# Patient Record
Sex: Female | Born: 1965
Health system: Southern US, Community
[De-identification: ages and names within clinical notes are randomized; demographics above are authoritative.]

## PROBLEM LIST (undated history)

## (undated) DIAGNOSIS — E079 Disorder of thyroid, unspecified: Secondary | ICD-10-CM

## (undated) DIAGNOSIS — F32A Depression, unspecified: Secondary | ICD-10-CM

## (undated) DIAGNOSIS — F329 Major depressive disorder, single episode, unspecified: Secondary | ICD-10-CM

## (undated) DIAGNOSIS — F419 Anxiety disorder, unspecified: Secondary | ICD-10-CM

## (undated) HISTORY — PX: TONSILLECTOMY AND ADENOIDECTOMY: SHX28

## (undated) HISTORY — DX: Disorder of thyroid, unspecified: E07.9

## (undated) HISTORY — DX: Anxiety disorder, unspecified: F41.9

## (undated) HISTORY — DX: Depression, unspecified: F32.A

## (undated) HISTORY — DX: Major depressive disorder, single episode, unspecified: F32.9

## (undated) HISTORY — PX: OTHER SURGICAL HISTORY: SHX169

## (undated) HISTORY — PX: KNEE SURGERY: SHX244

---

## 2001-05-07 ENCOUNTER — Ambulatory Visit (HOSPITAL_BASED_OUTPATIENT_CLINIC_OR_DEPARTMENT_OTHER): Admission: RE | Admit: 2001-05-07 | Discharge: 2001-05-07 | Payer: Self-pay | Admitting: Urology

## 2005-01-07 ENCOUNTER — Ambulatory Visit: Payer: Self-pay | Admitting: Otolaryngology

## 2006-11-11 ENCOUNTER — Ambulatory Visit: Payer: Self-pay | Admitting: Obstetrics and Gynecology

## 2007-11-17 ENCOUNTER — Ambulatory Visit: Payer: Self-pay | Admitting: Obstetrics and Gynecology

## 2008-08-02 ENCOUNTER — Ambulatory Visit: Payer: Self-pay | Admitting: Endocrinology

## 2008-12-06 ENCOUNTER — Ambulatory Visit: Payer: Self-pay | Admitting: Obstetrics and Gynecology

## 2008-12-13 ENCOUNTER — Ambulatory Visit: Payer: Self-pay | Admitting: Obstetrics and Gynecology

## 2009-06-20 ENCOUNTER — Ambulatory Visit: Payer: Self-pay | Admitting: Obstetrics and Gynecology

## 2010-01-21 ENCOUNTER — Ambulatory Visit: Payer: Self-pay | Admitting: Obstetrics and Gynecology

## 2010-04-02 ENCOUNTER — Ambulatory Visit: Payer: Self-pay | Admitting: Gastroenterology

## 2010-07-08 ENCOUNTER — Ambulatory Visit: Payer: Self-pay | Admitting: Endocrinology

## 2011-04-07 ENCOUNTER — Ambulatory Visit: Payer: Self-pay | Admitting: Obstetrics and Gynecology

## 2011-04-22 ENCOUNTER — Ambulatory Visit: Payer: Self-pay | Admitting: Obstetrics and Gynecology

## 2011-11-14 ENCOUNTER — Ambulatory Visit: Payer: Self-pay | Admitting: Endocrinology

## 2012-04-22 ENCOUNTER — Ambulatory Visit: Payer: Self-pay | Admitting: Obstetrics and Gynecology

## 2012-11-17 ENCOUNTER — Ambulatory Visit: Payer: Self-pay | Admitting: Endocrinology

## 2013-05-03 ENCOUNTER — Ambulatory Visit: Payer: Self-pay | Admitting: Obstetrics and Gynecology

## 2013-11-23 ENCOUNTER — Ambulatory Visit: Payer: Self-pay | Admitting: Endocrinology

## 2014-03-03 ENCOUNTER — Ambulatory Visit (INDEPENDENT_AMBULATORY_CARE_PROVIDER_SITE_OTHER): Payer: BC Managed Care – PPO

## 2014-03-03 ENCOUNTER — Other Ambulatory Visit: Payer: Self-pay | Admitting: Podiatry

## 2014-03-03 ENCOUNTER — Encounter: Payer: Self-pay | Admitting: Podiatry

## 2014-03-03 ENCOUNTER — Ambulatory Visit (INDEPENDENT_AMBULATORY_CARE_PROVIDER_SITE_OTHER): Payer: BC Managed Care – PPO | Admitting: Podiatry

## 2014-03-03 ENCOUNTER — Other Ambulatory Visit: Payer: Self-pay | Admitting: *Deleted

## 2014-03-03 VITALS — BP 139/88 | HR 81 | Resp 16 | Ht 70.5 in | Wt 196.0 lb

## 2014-03-03 DIAGNOSIS — M779 Enthesopathy, unspecified: Secondary | ICD-10-CM

## 2014-03-03 DIAGNOSIS — M7750 Other enthesopathy of unspecified foot: Secondary | ICD-10-CM

## 2014-03-03 MED ORDER — TRIAMCINOLONE ACETONIDE 10 MG/ML IJ SUSP: 10.0000 mg | Freq: Once | INTRAMUSCULAR | Status: DC

## 2014-03-03 MED ORDER — DICLOFENAC SODIUM 75 MG PO TBEC: 75.0000 mg | DELAYED_RELEASE_TABLET | Freq: Two times a day (BID) | ORAL | Status: DC

## 2014-03-03 NOTE — Progress Notes (Signed)
   Subjective:    Patient ID: Melissa Leonard, female    DOB: 12/25/1965, 48 y.o.   MRN: [id number]  HPI Comments: The right  foot ball of foot mostly sore. Between the first and 2nd met . On my feet all day as the day goes on it gets worse. Swells some . Been using muscle rub on it.   Foot Pain      Review of Systems  All other systems reviewed and are negative.      Objective:   Physical Exam        Assessment & Plan:

## 2014-03-03 NOTE — Progress Notes (Signed)
Subjective:     Patient ID: Melissa Leonard, female   DOB: August 09, 1965, 48 y.o.   MRN: [id number]  Foot Pain   patient presents stating I'm having pain around my second metatarsal on top and some swelling on the bottom and it's been hurting me for around 2 months. Affecting my ability to exercise or walk   Review of Systems  All other systems reviewed and are negative.      Objective:   Physical Exam  Nursing note and vitals reviewed. Constitutional: She is oriented to person, place, and time.  Cardiovascular: Intact distal pulses.   Musculoskeletal: Normal range of motion.  Neurological: She is oriented to person, place, and time.  Skin: Skin is warm.   neurovascular status intact with muscle strength adequate in range of motion within normal limits. Patient is noted to have good digital perfusion mild depression of the arch upon weightbearing and does have mild soft tissue swelling on the dorsum of the right foot. Upon palpation the pain is most irritative within the second metatarsal shaft with minimal discomfort in the metatarsal phalangeal joint and no plantar pain upon palpation     Assessment:     Appears to be more tendinitis with possibility for stress fracture second metatarsal and also does not appear to have at this time plantar inflammation    Plan:     H&P and x-rays reviewed. Did a dorsal injection 3 mg Kenalog 5 mg Xylocaine and instructed on ice therapy and placed on diclofenac 75 mg twice a day. Reappoint in 2 weeks earlier if any issues should occur

## 2014-03-17 ENCOUNTER — Ambulatory Visit: Payer: BC Managed Care – PPO | Admitting: Podiatry

## 2014-08-09 ENCOUNTER — Ambulatory Visit: Payer: Self-pay

## 2015-06-20 ENCOUNTER — Other Ambulatory Visit: Payer: Self-pay | Admitting: Obstetrics and Gynecology

## 2015-06-20 DIAGNOSIS — Z1231 Encounter for screening mammogram for malignant neoplasm of breast: Secondary | ICD-10-CM

## 2015-07-11 ENCOUNTER — Other Ambulatory Visit: Payer: Self-pay | Admitting: Endocrinology

## 2015-07-11 DIAGNOSIS — E041 Nontoxic single thyroid nodule: Secondary | ICD-10-CM

## 2015-08-13 ENCOUNTER — Ambulatory Visit
Admission: RE | Admit: 2015-08-13 | Discharge: 2015-08-13 | Disposition: A | Discharge status: Home / Self Care | Payer: BLUE CROSS/BLUE SHIELD | Source: Ambulatory Visit | Attending: Obstetrics and Gynecology | Admitting: Obstetrics and Gynecology

## 2015-08-13 DIAGNOSIS — Z1231 Encounter for screening mammogram for malignant neoplasm of breast: Secondary | ICD-10-CM | POA: Diagnosis not present

## 2015-11-08 ENCOUNTER — Ambulatory Visit
Admission: RE | Admit: 2015-11-08 | Discharge: 2015-11-08 | Disposition: A | Discharge status: Home / Self Care | Payer: BLUE CROSS/BLUE SHIELD | Source: Ambulatory Visit | Attending: Endocrinology | Admitting: Endocrinology

## 2015-11-08 DIAGNOSIS — E041 Nontoxic single thyroid nodule: Secondary | ICD-10-CM

## 2016-06-09 ENCOUNTER — Ambulatory Visit
Admission: EM | Admit: 2016-06-09 | Discharge: 2016-06-09 | Disposition: A | Discharge status: Home / Self Care | Payer: BLUE CROSS/BLUE SHIELD | Attending: Family Medicine | Admitting: Family Medicine

## 2016-06-09 DIAGNOSIS — T148XXA Other injury of unspecified body region, initial encounter: Secondary | ICD-10-CM

## 2016-06-09 DIAGNOSIS — L089 Local infection of the skin and subcutaneous tissue, unspecified: Secondary | ICD-10-CM

## 2016-06-09 DIAGNOSIS — J069 Acute upper respiratory infection, unspecified: Secondary | ICD-10-CM

## 2016-06-09 MED ORDER — CEFUROXIME AXETIL 500 MG PO TABS: 500.0000 mg | ORAL_TABLET | Freq: Two times a day (BID) | ORAL | 0 refills | Status: DC

## 2016-06-09 MED ORDER — MUPIROCIN 2 % EX OINT: 1.0000 "application " | TOPICAL_OINTMENT | Freq: Three times a day (TID) | CUTANEOUS | 0 refills | Status: DC

## 2016-06-09 NOTE — ED Triage Notes (Signed)
Patient complains of a fall last Wednesday when she was picking up her granddaughter. Patient states that she has a abrasion on her right knee. Patient states that she does not feel like this area is healing. Patient also reports that she has been having a cough and chest congestion x 5 days.

## 2016-06-09 NOTE — ED Provider Notes (Signed)
[facility]    CSN: [id number] Arrival date & time: 06/09/16  1430     History   Chief Complaint Chief Complaint  Patient presents with  . Knee Pain    right  . Cough    HPI Melissa Leonard is a 50 y.o. female.   Patient's here because of contusion to the right knee she reports falling last Wednesday. She states she was walking or carrying her sick granddaughter when because of the bulky coat granddaughter she didn't see this was stepping off a curb. She fell and basically sacralized to right knee keep her granddaughter from being hurt. Initially she didn't have any trouble with the right knee but now swollen tender to touch and she states when she took the bandages all today it was oozing a yellowish exudate material.  Problem #2 patient is also here because of a URI. She fell on Thursday but states that witnesses start coughing and chest congestion. She's been taking Mucinex DM but has not helped. History of anxiety depression and thyroid disease. The only significant history she has is that her father had prostate cancer. She never smoked. No known drug allergies.    The history is provided by the patient. No language interpreter was used.  Knee Pain  Location:  Knee Time since incident:  6 days Injury: yes   Knee location:  R knee Pain details:    Quality:  Throbbing and burning   Radiates to:  Does not radiate   Severity:  Moderate   Onset quality:  Sudden   Timing:  Constant   Progression:  Worsening Chronicity:  New Dislocation: yes   Tetanus status:  Up to date Prior injury to area:  Yes Relieved by:  Acetaminophen Worsened by:  Nothing Ineffective treatments:  Movement Associated symptoms: no back pain, no decreased ROM, no fatigue, no fever, no itching and no muscle weakness   Risk factors: no frequent fractures, no known bone disorder and no recent illness   Cough  Associated symptoms: wheezing   Associated symptoms: no fever     Past  Medical History:  Diagnosis Date  . Anxiety   . Depression   . Thyroid disease     There are no active problems to display for this patient.   History reviewed. No pertinent surgical history.  OB History    No data available       Home Medications    Prior to Admission medications   Medication Sig Start Date End Date Taking? Authorizing Provider  estradiol (VIVELLE-DOT) 0.0375 MG/24HR Place 1 patch onto the skin 2 (two) times a week.   Yes Historical Provider, MD  levothyroxine (SYNTHROID, LEVOTHROID) 50 MCG tablet  02/24/14  Yes Historical Provider, MD  PARoxetine (PAXIL) 20 MG tablet  02/03/14  Yes Historical Provider, MD  progesterone (PROMETRIUM) 100 MG capsule Take 100 mg by mouth daily.   Yes Historical Provider, MD  cefUROXime (CEFTIN) 500 MG tablet Take 1 tablet (500 mg total) by mouth 2 (two) times daily. 06/09/16   Hassan Rowan, MD  diclofenac (VOLTAREN) 75 MG EC tablet Take 1 tablet (75 mg total) by mouth 2 (two) times daily. 03/03/14   Lenn Sink, DPM  mupirocin ointment (BACTROBAN) 2 % Apply 1 application topically 3 (three) times daily. 06/09/16   Hassan Rowan, MD    Family History Family History  Problem Relation Age of Onset  . Prostate cancer Father     Social History Social History  Substance  Use Topics  . Smoking status: Never Smoker  . Smokeless tobacco: Never Used  . Alcohol use No     Allergies   Patient has no known allergies.   Review of Systems Review of Systems  Constitutional: Negative for fatigue and fever.  Respiratory: Positive for cough and wheezing.   Musculoskeletal: Negative for back pain.  Skin: Negative for itching.  All other systems reviewed and are negative.    Physical Exam Triage Vital Signs ED Triage Vitals  Enc Vitals Group     BP 06/09/16 1532 110/75     Pulse Rate 06/09/16 1532 90     Resp 06/09/16 1532 17     Temp 06/09/16 1532 98.6 F (37 C)     Temp Source 06/09/16 1532 Oral     SpO2 06/09/16 1532 97 %      Weight 06/09/16 1530 204 lb (92.5 kg)     Height 06/09/16 1530 5\' 10"  (1.778 m)     Head Circumference --      Peak Flow --      Pain Score 06/09/16 1532 3     Pain Loc --      Pain Edu? --      Excl. in GC? --    No data found.   Updated Vital Signs BP 110/75 (BP Location: Left Arm)   Pulse 90   Temp 98.6 F (37 C) (Oral)   Resp 17   Ht 5\' 10"  (1.778 m)   Wt 204 lb (92.5 kg)   SpO2 97%   BMI 29.27 kg/m   Visual Acuity Right Eye Distance:   Left Eye Distance:   Bilateral Distance:    Right Eye Near:   Left Eye Near:    Bilateral Near:     Physical Exam  Constitutional: She is oriented to person, place, and time. She appears well-developed and well-nourished. No distress.  HENT:  Head: Normocephalic and atraumatic.  Right Ear: External ear normal.  Left Ear: External ear normal.  Mouth/Throat: Oropharynx is clear and moist.  Eyes: Conjunctivae are normal. Pupils are equal, round, and reactive to light.  Neck: Normal range of motion. No tracheal deviation present.  Cardiovascular: Normal rate and regular rhythm.   Pulmonary/Chest: Effort normal and breath sounds normal. No respiratory distress. She has no decreased breath sounds. She has no wheezes.  Musculoskeletal: Normal range of motion.  Neurological: She is alert and oriented to person, place, and time.  Skin: Rash noted. She is not diaphoretic. There is erythema.  Psychiatric: She has a normal mood and affect.  Vitals reviewed.    UC Treatments / Results  Labs (all labs ordered are listed, but only abnormal results are displayed) Labs Reviewed - No data to display  EKG  EKG Interpretation None       Radiology No results found.  Procedures Procedures (including critical care time)  Medications Ordered in UC Medications - No data to display   Initial Impression / Assessment and Plan / UC Course  I have reviewed the triage vital signs and the nursing notes.  Pertinent labs & imaging  results that were available during my care of the patient were reviewed by me and considered in my medical decision making (see chart for details).  Clinical Course     For the cellulitis of the right knee will place her on Bactroban ointment to 3 times a day Ceftin 500 mg 1 tablet twice a day. For the URI question whether this is viral  or bacterial but the septum will work for that as well. Offered [employer] she declined and that also will question whether she needs decongestant which she also declined that. She also declined work note. Follow-up PCP in a week if not better.  Final Clinical Impressions(s) / UC Diagnoses   Final diagnoses:  Post-traumatic wound infection  Upper respiratory tract infection, unspecified type    New Prescriptions New Prescriptions   CEFUROXIME (CEFTIN) 500 MG TABLET    Take 1 tablet (500 mg total) by mouth 2 (two) times daily.   MUPIROCIN OINTMENT (BACTROBAN) 2 %    Apply 1 application topically 3 (three) times daily.    Note: This dictation was prepared with Dragon dictation along with smaller phrase technology. Any transcriptional errors that result from this process are unintentional.   Hassan RowanEugene Stpehen Petitjean, MD 06/09/16 1701

## 2016-06-12 ENCOUNTER — Telehealth: Payer: Self-pay

## 2016-06-12 NOTE — Telephone Encounter (Signed)
Courtesy call back completed today for patients recent visit at [facility]. Patient did not answer, left message on voicemail to call back with any questions or concerns.   

## 2016-07-25 ENCOUNTER — Other Ambulatory Visit: Payer: Self-pay | Admitting: Obstetrics and Gynecology

## 2016-07-25 DIAGNOSIS — Z1231 Encounter for screening mammogram for malignant neoplasm of breast: Secondary | ICD-10-CM

## 2016-08-19 ENCOUNTER — Ambulatory Visit
Admission: RE | Admit: 2016-08-19 | Discharge: 2016-08-19 | Disposition: A | Discharge status: Home / Self Care | Payer: BLUE CROSS/BLUE SHIELD | Source: Ambulatory Visit | Attending: Obstetrics and Gynecology | Admitting: Obstetrics and Gynecology

## 2016-08-19 DIAGNOSIS — R928 Other abnormal and inconclusive findings on diagnostic imaging of breast: Secondary | ICD-10-CM | POA: Insufficient documentation

## 2016-08-19 DIAGNOSIS — Z1231 Encounter for screening mammogram for malignant neoplasm of breast: Secondary | ICD-10-CM | POA: Diagnosis present

## 2016-08-28 ENCOUNTER — Other Ambulatory Visit: Payer: Self-pay | Admitting: Obstetrics and Gynecology

## 2016-08-28 DIAGNOSIS — N6489 Other specified disorders of breast: Secondary | ICD-10-CM

## 2016-08-28 DIAGNOSIS — R928 Other abnormal and inconclusive findings on diagnostic imaging of breast: Secondary | ICD-10-CM

## 2016-09-09 ENCOUNTER — Ambulatory Visit
Admission: RE | Admit: 2016-09-09 | Discharge: 2016-09-09 | Disposition: A | Discharge status: Home / Self Care | Payer: BLUE CROSS/BLUE SHIELD | Source: Ambulatory Visit | Attending: Obstetrics and Gynecology | Admitting: Obstetrics and Gynecology

## 2016-09-09 DIAGNOSIS — N6489 Other specified disorders of breast: Secondary | ICD-10-CM | POA: Diagnosis not present

## 2016-09-09 DIAGNOSIS — R928 Other abnormal and inconclusive findings on diagnostic imaging of breast: Secondary | ICD-10-CM

## 2016-09-15 ENCOUNTER — Other Ambulatory Visit: Payer: Self-pay | Admitting: Obstetrics and Gynecology

## 2016-09-15 DIAGNOSIS — N6489 Other specified disorders of breast: Secondary | ICD-10-CM

## 2016-09-25 ENCOUNTER — Other Ambulatory Visit: Payer: Self-pay | Admitting: Obstetrics and Gynecology

## 2016-09-25 DIAGNOSIS — N6489 Other specified disorders of breast: Secondary | ICD-10-CM

## 2016-11-11 ENCOUNTER — Other Ambulatory Visit: Payer: Self-pay | Admitting: Endocrinology

## 2016-11-11 DIAGNOSIS — E041 Nontoxic single thyroid nodule: Secondary | ICD-10-CM

## 2016-11-19 ENCOUNTER — Ambulatory Visit
Admission: RE | Admit: 2016-11-19 | Discharge: 2016-11-19 | Disposition: A | Discharge status: Home / Self Care | Payer: BLUE CROSS/BLUE SHIELD | Source: Ambulatory Visit | Attending: Endocrinology | Admitting: Endocrinology

## 2016-11-19 DIAGNOSIS — E041 Nontoxic single thyroid nodule: Secondary | ICD-10-CM

## 2016-11-19 DIAGNOSIS — E079 Disorder of thyroid, unspecified: Secondary | ICD-10-CM | POA: Insufficient documentation

## 2017-03-13 ENCOUNTER — Ambulatory Visit
Admission: RE | Admit: 2017-03-13 | Discharge: 2017-03-13 | Disposition: A | Discharge status: Home / Self Care | Payer: BLUE CROSS/BLUE SHIELD | Source: Ambulatory Visit | Attending: Obstetrics and Gynecology | Admitting: Obstetrics and Gynecology

## 2017-03-13 DIAGNOSIS — N6489 Other specified disorders of breast: Secondary | ICD-10-CM | POA: Diagnosis present

## 2017-08-10 ENCOUNTER — Other Ambulatory Visit: Payer: Self-pay | Admitting: Obstetrics and Gynecology

## 2017-08-10 DIAGNOSIS — N6489 Other specified disorders of breast: Secondary | ICD-10-CM

## 2017-08-24 ENCOUNTER — Ambulatory Visit
Admission: RE | Admit: 2017-08-24 | Discharge: 2017-08-24 | Disposition: A | Discharge status: Home / Self Care | Payer: BLUE CROSS/BLUE SHIELD | Source: Ambulatory Visit | Attending: Obstetrics and Gynecology | Admitting: Obstetrics and Gynecology

## 2017-08-24 ENCOUNTER — Other Ambulatory Visit: Payer: Self-pay | Admitting: Obstetrics and Gynecology

## 2017-08-24 DIAGNOSIS — N6489 Other specified disorders of breast: Secondary | ICD-10-CM | POA: Diagnosis present

## 2017-08-24 DIAGNOSIS — N6323 Unspecified lump in the left breast, lower outer quadrant: Secondary | ICD-10-CM | POA: Diagnosis not present

## 2017-08-24 DIAGNOSIS — N6320 Unspecified lump in the left breast, unspecified quadrant: Secondary | ICD-10-CM

## 2017-08-24 DIAGNOSIS — R928 Other abnormal and inconclusive findings on diagnostic imaging of breast: Secondary | ICD-10-CM | POA: Insufficient documentation

## 2017-09-17 ENCOUNTER — Other Ambulatory Visit: Payer: Self-pay | Admitting: Obstetrics and Gynecology

## 2017-09-17 DIAGNOSIS — N63 Unspecified lump in unspecified breast: Secondary | ICD-10-CM

## 2017-09-30 ENCOUNTER — Other Ambulatory Visit: Payer: BLUE CROSS/BLUE SHIELD

## 2017-11-09 ENCOUNTER — Other Ambulatory Visit: Payer: Self-pay | Admitting: Endocrinology

## 2017-11-09 DIAGNOSIS — E041 Nontoxic single thyroid nodule: Secondary | ICD-10-CM

## 2017-11-19 ENCOUNTER — Ambulatory Visit: Payer: BLUE CROSS/BLUE SHIELD

## 2018-02-23 ENCOUNTER — Ambulatory Visit
Admission: RE | Admit: 2018-02-23 | Discharge: 2018-02-23 | Disposition: A | Discharge status: Home / Self Care | Payer: BLUE CROSS/BLUE SHIELD | Source: Ambulatory Visit | Attending: Obstetrics and Gynecology | Admitting: Obstetrics and Gynecology

## 2018-02-23 DIAGNOSIS — N63 Unspecified lump in unspecified breast: Secondary | ICD-10-CM

## 2018-10-27 ENCOUNTER — Other Ambulatory Visit: Payer: Self-pay | Admitting: Endocrinology

## 2018-10-27 DIAGNOSIS — E041 Nontoxic single thyroid nodule: Secondary | ICD-10-CM

## 2018-11-05 ENCOUNTER — Ambulatory Visit
Admission: RE | Admit: 2018-11-05 | Discharge: 2018-11-05 | Disposition: A | Discharge status: Home / Self Care | Payer: BLUE CROSS/BLUE SHIELD | Source: Ambulatory Visit | Attending: Endocrinology | Admitting: Endocrinology

## 2018-11-05 ENCOUNTER — Other Ambulatory Visit: Payer: Self-pay

## 2018-11-05 DIAGNOSIS — E041 Nontoxic single thyroid nodule: Secondary | ICD-10-CM | POA: Diagnosis not present

## 2018-12-29 ENCOUNTER — Other Ambulatory Visit: Payer: Self-pay | Admitting: Obstetrics and Gynecology

## 2018-12-29 DIAGNOSIS — Z1231 Encounter for screening mammogram for malignant neoplasm of breast: Secondary | ICD-10-CM

## 2018-12-29 DIAGNOSIS — Z872 Personal history of diseases of the skin and subcutaneous tissue: Secondary | ICD-10-CM

## 2019-01-24 ENCOUNTER — Other Ambulatory Visit: Payer: BLUE CROSS/BLUE SHIELD

## 2019-02-14 ENCOUNTER — Ambulatory Visit
Admission: RE | Admit: 2019-02-14 | Discharge: 2019-02-14 | Disposition: A | Discharge status: Home / Self Care | Payer: BC Managed Care – PPO | Source: Ambulatory Visit | Attending: Obstetrics and Gynecology | Admitting: Obstetrics and Gynecology

## 2019-02-14 DIAGNOSIS — Z872 Personal history of diseases of the skin and subcutaneous tissue: Secondary | ICD-10-CM | POA: Diagnosis present

## 2019-02-14 DIAGNOSIS — Z1231 Encounter for screening mammogram for malignant neoplasm of breast: Secondary | ICD-10-CM

## 2020-01-03 ENCOUNTER — Other Ambulatory Visit: Payer: Self-pay | Admitting: Obstetrics and Gynecology

## 2020-01-05 ENCOUNTER — Other Ambulatory Visit: Payer: Self-pay | Admitting: Obstetrics and Gynecology

## 2020-01-05 DIAGNOSIS — Z1231 Encounter for screening mammogram for malignant neoplasm of breast: Secondary | ICD-10-CM

## 2020-02-22 ENCOUNTER — Other Ambulatory Visit: Payer: Self-pay

## 2020-02-22 ENCOUNTER — Ambulatory Visit
Admission: RE | Admit: 2020-02-22 | Discharge: 2020-02-22 | Disposition: A | Discharge status: Home / Self Care | Payer: BC Managed Care – PPO | Source: Ambulatory Visit | Attending: Obstetrics and Gynecology | Admitting: Obstetrics and Gynecology

## 2020-02-22 DIAGNOSIS — Z1231 Encounter for screening mammogram for malignant neoplasm of breast: Secondary | ICD-10-CM | POA: Insufficient documentation

## 2020-09-24 IMAGING — MG DIGITAL SCREENING BILAT W/ TOMO W/ CAD
8 series · 8 of 24 positions shown · non-contrast
Comparison: Previous exam(s).

CLINICAL DATA: Screening.

EXAM:
DIGITAL SCREENING BILATERAL MAMMOGRAM WITH TOMO AND CAD

[L MLO synth-2D]
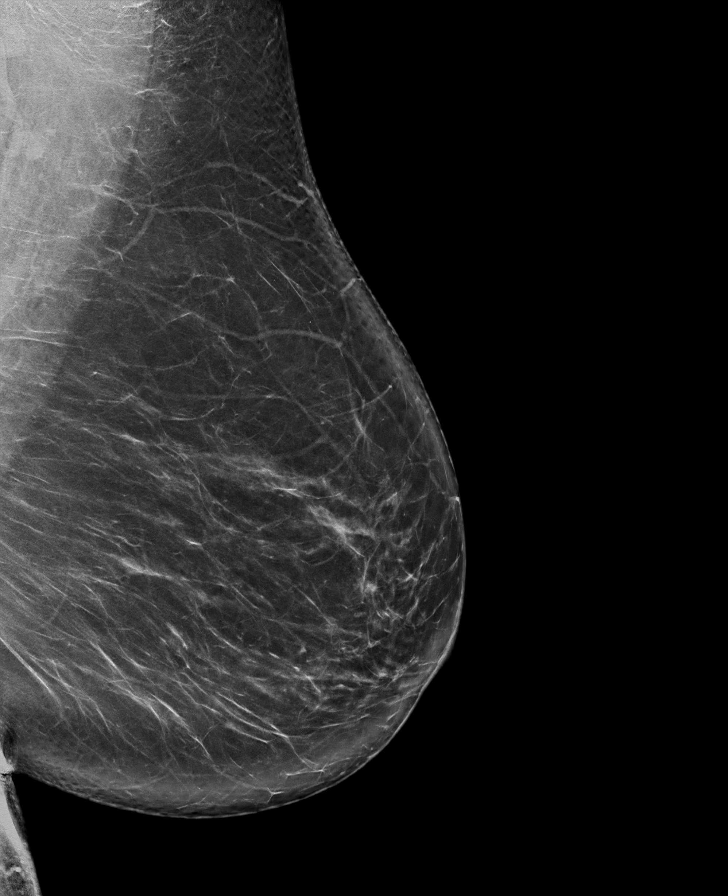

[R CC synth-2D]
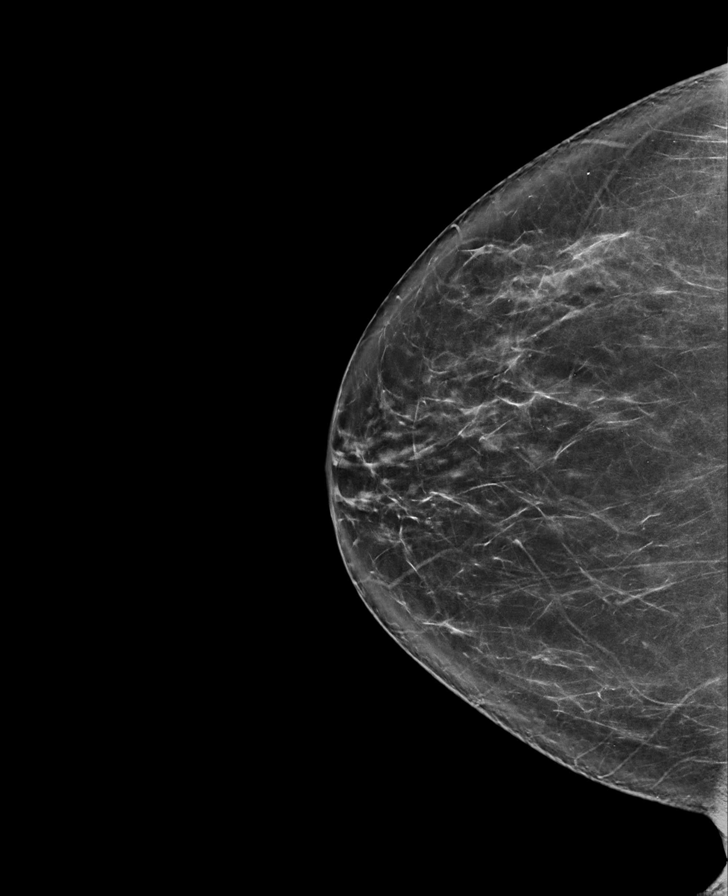

[L CC synth-2D]
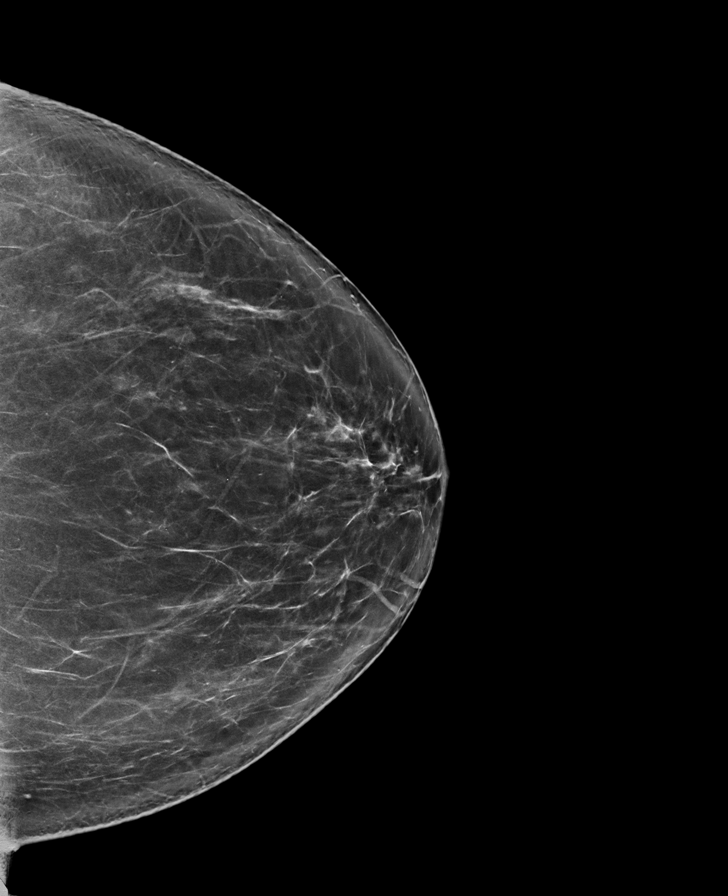

[R MLO synth-2D]
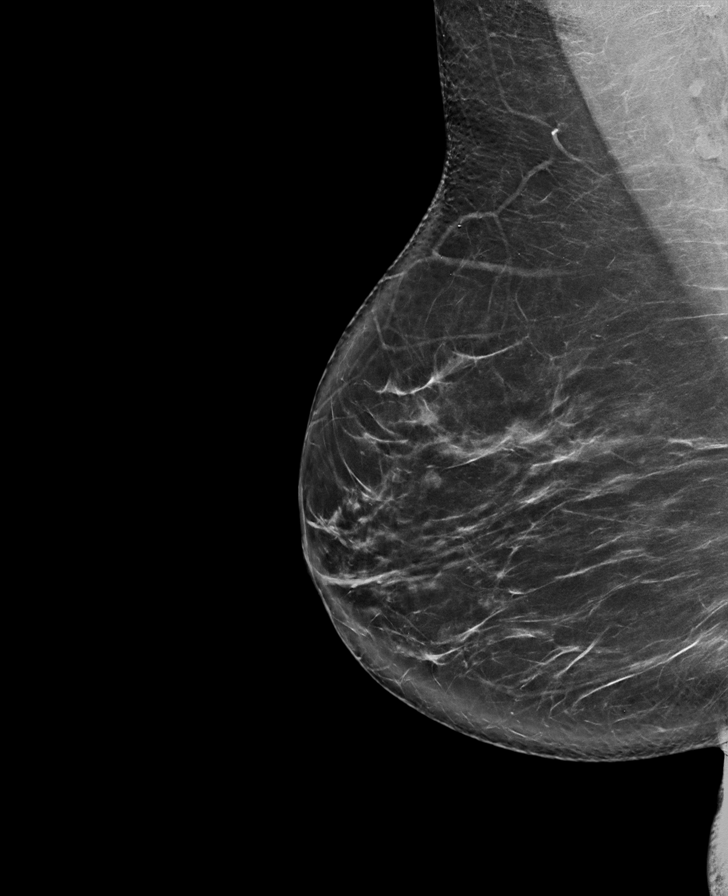

[L CC tomo · tomo slice 43/85.0]
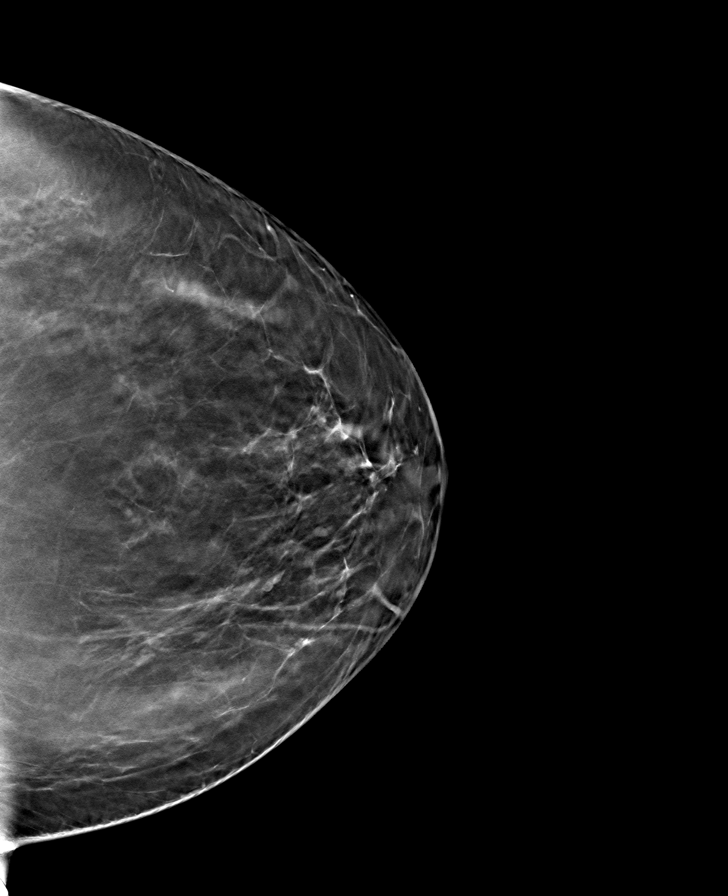

[R CC tomo · tomo slice 41/81.0]
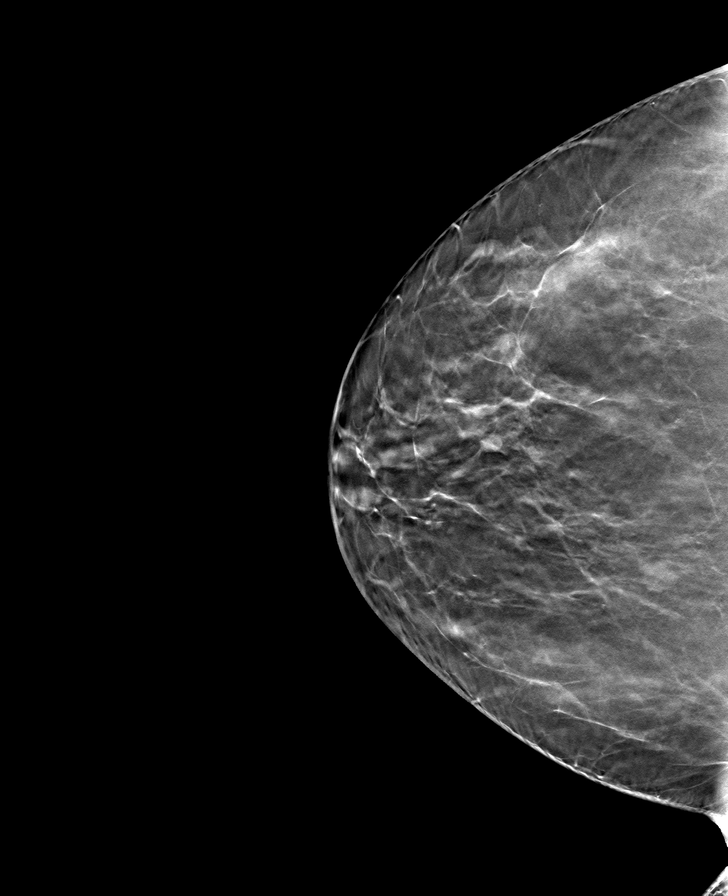

[R MLO tomo · tomo slice 45/88.0]
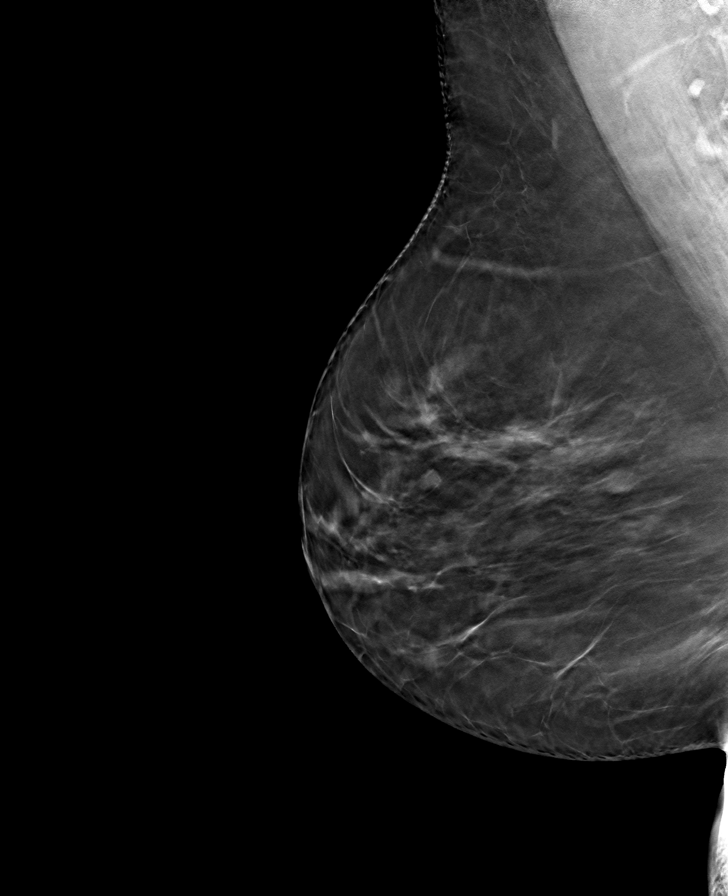

[L MLO tomo · tomo slice 47/92.0]
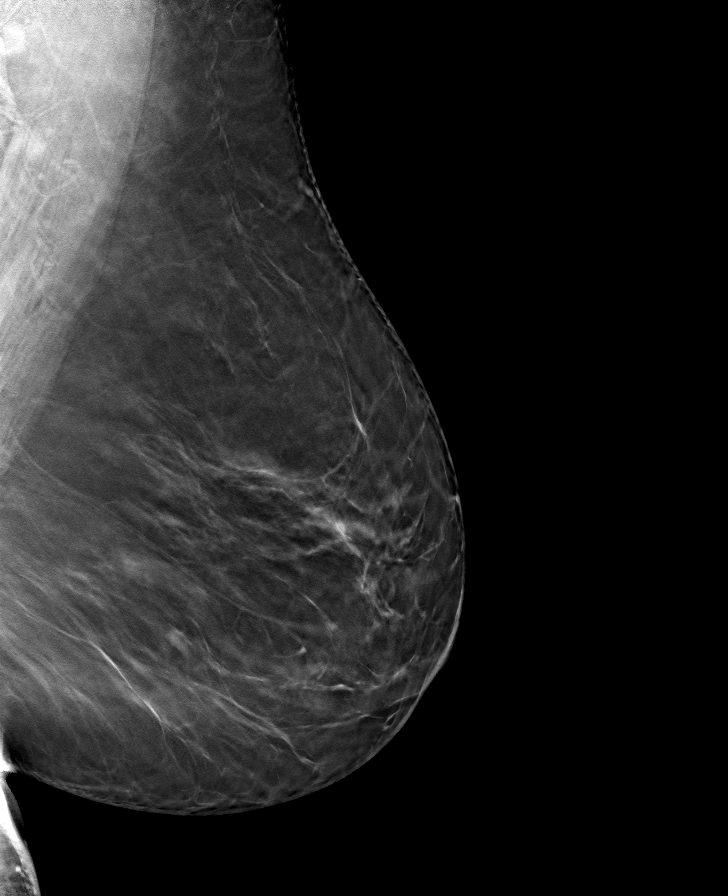

[8 of 24 positions shown; findings below may reference images not displayed]

ACR Breast Density Category b: There are scattered areas of
fibroglandular density.
FINDINGS: There are no findings suspicious for malignancy. Images were
processed with CAD.
IMPRESSION: No mammographic evidence of malignancy. A result letter of this
screening mammogram will be mailed directly to the patient.

RECOMMENDATION:
Screening mammogram in one year. (Code:[id number]-775)

BI-RADS CATEGORY  1: Negative.

## 2021-01-16 ENCOUNTER — Other Ambulatory Visit: Payer: Self-pay | Admitting: Obstetrics and Gynecology

## 2021-01-16 DIAGNOSIS — Z1231 Encounter for screening mammogram for malignant neoplasm of breast: Secondary | ICD-10-CM

## 2021-03-07 ENCOUNTER — Other Ambulatory Visit: Payer: Self-pay

## 2021-03-07 ENCOUNTER — Ambulatory Visit
Admission: RE | Admit: 2021-03-07 | Discharge: 2021-03-07 | Disposition: A | Discharge status: Home / Self Care | Payer: BC Managed Care – PPO | Source: Ambulatory Visit | Attending: Obstetrics and Gynecology | Admitting: Obstetrics and Gynecology

## 2021-03-07 DIAGNOSIS — Z1231 Encounter for screening mammogram for malignant neoplasm of breast: Secondary | ICD-10-CM | POA: Diagnosis not present

## 2021-06-07 ENCOUNTER — Other Ambulatory Visit: Payer: Self-pay

## 2021-06-07 ENCOUNTER — Ambulatory Visit
Admission: RE | Admit: 2021-06-07 | Discharge: 2021-06-07 | Disposition: A | Discharge status: Home / Self Care | Payer: BC Managed Care – PPO | Source: Ambulatory Visit | Attending: Emergency Medicine | Admitting: Emergency Medicine

## 2021-06-07 VITALS — BP 147/81 | HR 100 | Temp 98.5°F | Resp 16

## 2021-06-07 DIAGNOSIS — B349 Viral infection, unspecified: Secondary | ICD-10-CM

## 2021-06-07 NOTE — Discharge Instructions (Addendum)
Your COVID and Flu tests are pending.  You should self quarantine until the test results are back.    Take Tylenol or ibuprofen as needed for fever or discomfort.  Rest and keep yourself hydrated.    Follow-up with your primary care provider if your symptoms are not improving.     

## 2021-06-07 NOTE — ED Provider Notes (Signed)
[facility]    CSN: [id number] Arrival date & time: 06/07/21  1517      History   Chief Complaint Chief Complaint  Patient presents with   Cold Symptoms    HPI Melissa Leonard is a 55 y.o. female.  Patient presents with body aches, itching ears, congestion, runny nose, mild cough since yesterday.  She had sore throat last weekend but now is just scratchy.  No fever, rash, ear pain, shortness of breath, vomiting, diarrhea, or other symptoms.  Treatment attempted with Mucinex and Advil.    The history is provided by the patient and medical records.   Past Medical History:  Diagnosis Date   Anxiety    Depression    Thyroid disease     There are no problems to display for this patient.   History reviewed. No pertinent surgical history.  OB History   No obstetric history on file.      Home Medications    Prior to Admission medications   Medication Sig Start Date End Date Taking? Authorizing Provider  cefUROXime (CEFTIN) 500 MG tablet Take 1 tablet (500 mg total) by mouth 2 (two) times daily. 06/09/16   Hassan Rowan, MD  diclofenac (VOLTAREN) 75 MG EC tablet Take 1 tablet (75 mg total) by mouth 2 (two) times daily. 03/03/14   Lenn Sink, DPM  estradiol (VIVELLE-DOT) 0.0375 MG/24HR Place 1 patch onto the skin 2 (two) times a week.    [provider]  levothyroxine (SYNTHROID, LEVOTHROID) 50 MCG tablet  02/24/14   [provider]  mupirocin ointment (BACTROBAN) 2 % Apply 1 application topically 3 (three) times daily. 06/09/16   Hassan Rowan, MD  PARoxetine (PAXIL) 20 MG tablet  02/03/14   [provider]  progesterone (PROMETRIUM) 100 MG capsule Take 100 mg by mouth daily.    [provider]    Family History Family History  Problem Relation Age of Onset   Prostate cancer Father    Breast cancer Neg Hx     Social History Social History   Tobacco Use   Smoking status: Never   Smokeless tobacco: Never  Vaping  Use   Vaping Use: Never used  Substance Use Topics   Alcohol use: No   Drug use: No     Allergies   Patient has no known allergies.   Review of Systems Review of Systems  Constitutional:  Negative for chills and fever.  HENT:  Positive for congestion, rhinorrhea and sore throat. Negative for ear pain.   Respiratory:  Positive for cough. Negative for shortness of breath.   Cardiovascular:  Negative for chest pain and palpitations.  Gastrointestinal:  Negative for diarrhea and vomiting.  Skin:  Negative for color change and rash.  All other systems reviewed and are negative.   Physical Exam Triage Vital Signs ED Triage Vitals [06/07/21 1535]  Enc Vitals Group     BP      Pulse      Resp      Temp      Temp src      SpO2      Weight      Height      Head Circumference      Peak Flow      Pain Score 0     Pain Loc      Pain Edu?      Excl. in GC?    No data found.  Updated Vital Signs BP Marland Kitchen)  147/81 (BP Location: Left Arm)    Pulse 100    Temp 98.5 F (36.9 C) (Oral)    Resp 16    SpO2 95%   Visual Acuity Right Eye Distance:   Left Eye Distance:   Bilateral Distance:    Right Eye Near:   Left Eye Near:    Bilateral Near:     Physical Exam Vitals and nursing note reviewed.  Constitutional:      General: She is not in acute distress.    Appearance: She is well-developed. She is not ill-appearing.  HENT:     Right Ear: Tympanic membrane normal.     Left Ear: Tympanic membrane normal.     Nose: Nose normal.     Mouth/Throat:     Mouth: Mucous membranes are moist.     Pharynx: Oropharynx is clear.  Cardiovascular:     Rate and Rhythm: Normal rate and regular rhythm.     Heart sounds: Normal heart sounds.  Pulmonary:     Effort: Pulmonary effort is normal. No respiratory distress.     Breath sounds: Normal breath sounds.  Musculoskeletal:     Cervical back: Neck supple.  Skin:    General: Skin is warm and dry.  Neurological:     Mental Status: She  is alert.  Psychiatric:        Mood and Affect: Mood normal.        Behavior: Behavior normal.     UC Treatments / Results  Labs (all labs ordered are listed, but only abnormal results are displayed) Labs Reviewed  COVID-19, FLU A+B NAA    EKG   Radiology No results found.  Procedures Procedures (including critical care time)  Medications Ordered in UC Medications - No data to display  Initial Impression / Assessment and Plan / UC Course  I have reviewed the triage vital signs and the nursing notes.  Pertinent labs & imaging results that were available during my care of the patient were reviewed by me and considered in my medical decision making (see chart for details).    Viral illness.  COVID and Flu pending.  Instructed patient to self quarantine per CDC guidelines.  Discussed symptomatic treatment including Tylenol or ibuprofen, rest, hydration.  Instructed patient to follow up with PCP if symptoms are not improving.  Patient agrees to plan of care.   Final Clinical Impressions(s) / UC Diagnoses   Final diagnoses:  Viral illness     Discharge Instructions      Your COVID and Flu tests are pending.  You should self quarantine until the test results are back.    Take Tylenol or ibuprofen as needed for fever or discomfort.  Rest and keep yourself hydrated.    Follow-up with your primary care provider if your symptoms are not improving.         ED Prescriptions   None    PDMP not reviewed this encounter.   Mickie Bail, NP 06/07/21 1550

## 2021-06-07 NOTE — ED Triage Notes (Signed)
Pt c/o scratchy throat, bodyaches, itchy ears, and chest tightness x 1 week.

## 2021-06-08 LAB — COVID-19, FLU A+B NAA
Influenza A, NAA: NOT DETECTED
Influenza B, NAA: NOT DETECTED
SARS-CoV-2, NAA: DETECTED — AB

## 2021-11-11 ENCOUNTER — Other Ambulatory Visit: Payer: Self-pay | Admitting: Endocrinology

## 2021-11-11 DIAGNOSIS — E049 Nontoxic goiter, unspecified: Secondary | ICD-10-CM

## 2021-11-20 ENCOUNTER — Ambulatory Visit
Admission: RE | Admit: 2021-11-20 | Discharge: 2021-11-20 | Disposition: A | Discharge status: Home / Self Care | Payer: BC Managed Care – PPO | Source: Ambulatory Visit | Attending: Endocrinology | Admitting: Endocrinology

## 2021-11-20 DIAGNOSIS — E049 Nontoxic goiter, unspecified: Secondary | ICD-10-CM

## 2022-01-24 ENCOUNTER — Encounter: Payer: Self-pay | Admitting: Emergency Medicine

## 2022-01-24 ENCOUNTER — Ambulatory Visit
Admission: EM | Admit: 2022-01-24 | Discharge: 2022-01-24 | Disposition: A | Discharge status: Home / Self Care | Payer: BC Managed Care – PPO | Attending: Emergency Medicine | Admitting: Emergency Medicine

## 2022-01-24 DIAGNOSIS — H9201 Otalgia, right ear: Secondary | ICD-10-CM | POA: Diagnosis not present

## 2022-01-24 MED ORDER — NEOMYCIN-POLYMYXIN-HC 3.5-10000-1 OT SUSP: 4.0000 [drp] | Freq: Three times a day (TID) | OTIC | 0 refills | Status: DC

## 2022-01-24 NOTE — ED Provider Notes (Signed)
[name]    CSN: [id number] Arrival date & time: 01/24/22  1055      History   Chief Complaint Chief Complaint  Patient presents with   Ear Fullness    HPI Melissa Leonard is a 56 y.o. female.   Patient presents with right-sided ear fullness for 4 days.  Has accompanying nasal congestion.  Has attempted use of over-the-counter eardrops which has been ineffective.  Endorses that she has been swimming more frequently.  Denies itching, drainage, pain, fever, chills, sore throat.   Past Medical History:  Diagnosis Date   Anxiety    Depression    Thyroid disease     There are no problems to display for this patient.   History reviewed. No pertinent surgical history.  OB History   No obstetric history on file.      Home Medications    Prior to Admission medications   Medication Sig Start Date End Date Taking? Authorizing Provider  estradiol (VIVELLE-DOT) 0.0375 MG/24HR Place 1 patch onto the skin 2 (two) times a week.   Yes [provider]  levothyroxine (SYNTHROID, LEVOTHROID) 50 MCG tablet  02/24/14  Yes [provider]  neomycin-polymyxin-hydrocortisone (CORTISPORIN) 3.5-10000-1 OTIC suspension Place 4 drops into the right ear 3 (three) times daily. 01/24/22  Yes Valinda Hoar, NP  PARoxetine (PAXIL) 20 MG tablet  02/03/14  Yes [provider]  progesterone (PROMETRIUM) 100 MG capsule Take 100 mg by mouth daily.   Yes [provider]  cefUROXime (CEFTIN) 500 MG tablet Take 1 tablet (500 mg total) by mouth 2 (two) times daily. 06/09/16   Hassan Rowan, MD  diclofenac (VOLTAREN) 75 MG EC tablet Take 1 tablet (75 mg total) by mouth 2 (two) times daily. 03/03/14   Lenn Sink, DPM  mupirocin ointment (BACTROBAN) 2 % Apply 1 application topically 3 (three) times daily. 06/09/16   Hassan Rowan, MD    Family History Family History  Problem Relation Age of Onset   Prostate cancer Father    Breast cancer Neg Hx      Social History Social History   Tobacco Use   Smoking status: Never   Smokeless tobacco: Never  Vaping Use   Vaping Use: Never used  Substance Use Topics   Alcohol use: No   Drug use: No     Allergies   Patient has no known allergies.   Review of Systems Review of Systems Defer to HPI    Physical Exam Triage Vital Signs ED Triage Vitals  Enc Vitals Group     BP 01/24/22 1103 (!) 142/87     Pulse Rate 01/24/22 1103 74     Resp 01/24/22 1103 16     Temp 01/24/22 1103 98.7 F (37.1 C)     Temp Source 01/24/22 1103 Oral     SpO2 01/24/22 1103 95 %     Weight --      Height --      Head Circumference --      Peak Flow --      Pain Score 01/24/22 1105 0     Pain Loc --      Pain Edu? --      Excl. in GC? --    No data found.  Updated Vital Signs BP (!) 142/87 (BP Location: Left Arm)   Pulse 74   Temp 98.7 F (37.1 C) (Oral)   Resp 16   SpO2 95%   Visual Acuity Right Eye Distance:  Left Eye Distance:   Bilateral Distance:    Right Eye Near:   Left Eye Near:    Bilateral Near:     Physical Exam Constitutional:      Appearance: Normal appearance.  HENT:     Head: Normocephalic.     Right Ear: Tympanic membrane, ear canal and external ear normal.     Left Ear: Tympanic membrane, ear canal and external ear normal.     Nose: Congestion present.     Mouth/Throat:     Mouth: Mucous membranes are moist.     Pharynx: Oropharynx is clear.  Eyes:     Extraocular Movements: Extraocular movements intact.  Pulmonary:     Effort: Pulmonary effort is normal.     Comments: Dry cough witnessed Skin:    General: Skin is warm and dry.  Neurological:     Mental Status: She is alert and oriented to person, place, and time. Mental status is at baseline.      UC Treatments / Results  Labs (all labs ordered are listed, but only abnormal results are displayed) Labs Reviewed - No data to display  EKG   Radiology No results  found.  Procedures Procedures (including critical care time)  Medications Ordered in UC Medications - No data to display  Initial Impression / Assessment and Plan / UC Course  I have reviewed the triage vital signs and the nursing notes.  Pertinent labs & imaging results that were available during my care of the patient were reviewed by me and considered in my medical decision making (see chart for details).  Otalgia of right ear  Stable patient is in no signs of distress, no abnormalities noted to the bilateral ears, will prophylactically provide coverage Cortisporin drops prescribed, recommended Flonase to be used in conjunction as congestion is most likely a due to symptoms, may follow-up with urgent care as needed if symptoms persist or worsen Final Clinical Impressions(s) / UC Diagnoses   Final diagnoses:  Otalgia of right ear     Discharge Instructions      On exam there were no other abnormalities to the ear  We will begin eardrops prophylactically, Place 4 drops into the right ear 3 times a day (every 8 hours) for 7 days  Your congestion may also be aiding to your ear pain, begin over-the-counter Flonase every morning and every evening to help clear sinuses  You may use Tylenol or ibuprofen for management of discomfort  May hold warm compresses to the ear for additional comfort  Please not attempted any ear cleaning or object or fluid placement into the ear canal to prevent further irritation    ED Prescriptions     Medication Sig Dispense Auth. Provider   neomycin-polymyxin-hydrocortisone (CORTISPORIN) 3.5-10000-1 OTIC suspension Place 4 drops into the right ear 3 (three) times daily. 10 mL Valinda Hoar, NP      PDMP not reviewed this encounter.   Valinda Hoar, NP 01/24/22 1135

## 2022-01-24 NOTE — Discharge Instructions (Addendum)
On exam there were no other abnormalities to the ear  We will begin eardrops prophylactically, Place 4 drops into the right ear 3 times a day (every 8 hours) for 7 days  Your congestion may also be aiding to your ear pain, begin over-the-counter Flonase every morning and every evening to help clear sinuses  You may use Tylenol or ibuprofen for management of discomfort  May hold warm compresses to the ear for additional comfort  Please not attempted any ear cleaning or object or fluid placement into the ear canal to prevent further irritation

## 2022-01-24 NOTE — ED Triage Notes (Signed)
Patient c/o RT ear fullness x 4 days.   Patient denies pain or muffled ear sounds. Patient denies trauma to ear.   Patient endorses swimming this past week.   Patient has used OTC ear drops with no relief of symptoms.

## 2022-02-18 ENCOUNTER — Other Ambulatory Visit: Payer: Self-pay | Admitting: Obstetrics and Gynecology

## 2022-02-18 DIAGNOSIS — Z1231 Encounter for screening mammogram for malignant neoplasm of breast: Secondary | ICD-10-CM

## 2022-03-31 ENCOUNTER — Ambulatory Visit
Admission: EM | Admit: 2022-03-31 | Discharge: 2022-03-31 | Disposition: A | Discharge status: Home / Self Care | Payer: BC Managed Care – PPO | Attending: Family Medicine | Admitting: Family Medicine

## 2022-03-31 ENCOUNTER — Encounter: Payer: Self-pay | Admitting: Emergency Medicine

## 2022-03-31 DIAGNOSIS — J209 Acute bronchitis, unspecified: Secondary | ICD-10-CM | POA: Diagnosis not present

## 2022-03-31 MED ORDER — PROMETHAZINE-DM 6.25-15 MG/5ML PO SYRP: 5.0000 mL | ORAL_SOLUTION | Freq: Four times a day (QID) | ORAL | 0 refills | Status: DC | PRN

## 2022-03-31 MED ORDER — BUDESONIDE-FORMOTEROL FUMARATE 160-4.5 MCG/ACT IN AERO: 2.0000 | INHALATION_SPRAY | Freq: Two times a day (BID) | RESPIRATORY_TRACT | 0 refills | Status: DC

## 2022-03-31 NOTE — ED Triage Notes (Signed)
Symptoms x 1 week. Cough, nasal congestion, ear fullness, headaches. Mucinex helps. Denies SOB, CP, abdominal pain, N/V/D. At home covid test negative twice over the last week. Works as a [employer] manager, family also sick recently.

## 2022-03-31 NOTE — ED Provider Notes (Signed)
[facility]   CSN: [id number] Arrival date & time: 03/31/22  1237     History   Chief Complaint Chief Complaint  Patient presents with   Cough    HPI Melissa Leonard is a 56 y.o. female.   Symptoms x 1 week. Cough, nasal congestion, ear fullness, headaches. Mucinex helps. Denies SOB, CP, abdominal pain, N/V/D. At home covid test negative twice over the last week. Works as a [employer] manager, family also sick recently.       Past Medical History:  Diagnosis Date   Anxiety    Depression    Thyroid disease     There are no problems to display for this patient.   History reviewed. No pertinent surgical history.  OB History   No obstetric history on file.      Home Medications    Prior to Admission medications   Medication Sig Start Date End Date Taking? Authorizing Provider  budesonide-formoterol (SYMBICORT) 160-4.5 MCG/ACT inhaler Inhale 2 puffs into the lungs 2 (two) times daily. 03/31/22  Yes Volney American, PA-C  promethazine-dextromethorphan (PROMETHAZINE-DM) 6.25-15 MG/5ML syrup Take 5 mLs by mouth 4 (four) times daily as needed. 03/31/22  Yes Volney American, PA-C  cefUROXime (CEFTIN) 500 MG tablet Take 1 tablet (500 mg total) by mouth 2 (two) times daily. 06/09/16   Frederich Cha, MD  diclofenac (VOLTAREN) 75 MG EC tablet Take 1 tablet (75 mg total) by mouth 2 (two) times daily. 03/03/14   Wallene Huh, DPM  estradiol (VIVELLE-DOT) 0.0375 MG/24HR Place 1 patch onto the skin 2 (two) times a week.    [provider]  levothyroxine (SYNTHROID, LEVOTHROID) 50 MCG tablet  02/24/14   [provider]  mupirocin ointment (BACTROBAN) 2 % Apply 1 application topically 3 (three) times daily. 06/09/16   Frederich Cha, MD  neomycin-polymyxin-hydrocortisone (CORTISPORIN) 3.5-10000-1 OTIC suspension Place 4 drops into the right ear 3 (three) times daily. 01/24/22   Hans Eden, NP  PARoxetine (PAXIL) 20 MG tablet  02/03/14    [provider]  progesterone (PROMETRIUM) 100 MG capsule Take 100 mg by mouth daily.    [provider]    Family History Family History  Problem Relation Age of Onset   Prostate cancer Father    Breast cancer Neg Hx     Social History Social History   Tobacco Use   Smoking status: Never   Smokeless tobacco: Never  Vaping Use   Vaping Use: Never used  Substance Use Topics   Alcohol use: No   Drug use: No     Allergies   Patient has no known allergies.   Review of Systems Review of Systems PER HPI  Physical Exam Triage Vital Signs ED Triage Vitals  Enc Vitals Group     BP 03/31/22 1302 (!) 146/96     Pulse Rate 03/31/22 1302 86     Resp 03/31/22 1302 16     Temp 03/31/22 1302 98.1 F (36.7 C)     Temp Source 03/31/22 1302 Oral     SpO2 03/31/22 1302 98 %     Weight --      Height --      Head Circumference --      Peak Flow --      Pain Score 03/31/22 1304 0     Pain Loc --      Pain Edu? --      Excl. in [place]? --    No data found.  Updated Vital Signs BP (!) 146/96 (BP Location: Right Arm)   Pulse 86   Temp 98.1 F (36.7 C) (Oral)   Resp 16   SpO2 98%   Visual Acuity Right Eye Distance:   Left Eye Distance:   Bilateral Distance:    Right Eye Near:   Left Eye Near:    Bilateral Near:     Physical Exam Vitals and nursing note reviewed.  Constitutional:      Appearance: Normal appearance.  HENT:     Head: Atraumatic.     Right Ear: Tympanic membrane and external ear normal.     Left Ear: Tympanic membrane and external ear normal.     Nose: Rhinorrhea present.     Mouth/Throat:     Mouth: Mucous membranes are moist.     Pharynx: Posterior oropharyngeal erythema present.  Eyes:     Extraocular Movements: Extraocular movements intact.     Conjunctiva/sclera: Conjunctivae normal.  Cardiovascular:     Rate and Rhythm: Normal rate and regular rhythm.     Heart sounds: Normal heart sounds.  Pulmonary:     Effort:  Pulmonary effort is normal.     Breath sounds: Normal breath sounds. No wheezing or rales.  Musculoskeletal:        General: Normal range of motion.     Cervical back: Normal range of motion and neck supple.  Skin:    General: Skin is warm and dry.  Neurological:     Mental Status: She is alert and oriented to person, place, and time.  Psychiatric:        Mood and Affect: Mood normal.        Thought Content: Thought content normal.      UC Treatments / Results  Labs (all labs ordered are listed, but only abnormal results are displayed) Labs Reviewed - No data to display  EKG   Radiology No results found.  Procedures Procedures (including critical care time)  Medications Ordered in UC Medications - No data to display  Initial Impression / Assessment and Plan / UC Course  I have reviewed the triage vital signs and the nursing notes.  Pertinent labs & imaging results that were available during my care of the patient were reviewed by me and considered in my medical decision making (see chart for details).     Suspect postviral bronchitis, she states she does not tolerate oral steroids so will prescribe Symbicort, Phenergan DM and discussed supportive over-the-counter medications and home care additionally.  Return for worsening symptoms.  Final Clinical Impressions(s) / UC Diagnoses   Final diagnoses:  Acute bronchitis, unspecified organism   Discharge Instructions   None    ED Prescriptions     Medication Sig Dispense Auth. Provider   budesonide-formoterol (SYMBICORT) 160-4.5 MCG/ACT inhaler Inhale 2 puffs into the lungs 2 (two) times daily. 1 each Particia Nearing, PA-C   promethazine-dextromethorphan (PROMETHAZINE-DM) 6.25-15 MG/5ML syrup Take 5 mLs by mouth 4 (four) times daily as needed. 100 mL Particia Nearing, [place]      PDMP not reviewed this encounter.   Arin, Vanosdol, [place] 03/31/22 1344

## 2022-04-13 ENCOUNTER — Encounter (HOSPITAL_COMMUNITY): Payer: Self-pay

## 2022-04-13 ENCOUNTER — Emergency Department (HOSPITAL_COMMUNITY): Payer: BC Managed Care – PPO

## 2022-04-13 ENCOUNTER — Other Ambulatory Visit: Payer: Self-pay

## 2022-04-13 ENCOUNTER — Emergency Department (HOSPITAL_COMMUNITY)
Admission: EM | Admit: 2022-04-13 | Discharge: 2022-04-13 | Disposition: A | Discharge status: Home / Self Care | Payer: BC Managed Care – PPO | Attending: Emergency Medicine | Admitting: Emergency Medicine

## 2022-04-13 DIAGNOSIS — R079 Chest pain, unspecified: Secondary | ICD-10-CM | POA: Diagnosis present

## 2022-04-13 DIAGNOSIS — R0789 Other chest pain: Secondary | ICD-10-CM | POA: Insufficient documentation

## 2022-04-13 DIAGNOSIS — I10 Essential (primary) hypertension: Secondary | ICD-10-CM | POA: Diagnosis not present

## 2022-04-13 DIAGNOSIS — E039 Hypothyroidism, unspecified: Secondary | ICD-10-CM | POA: Insufficient documentation

## 2022-04-13 LAB — BASIC METABOLIC PANEL
Anion gap: 6 (ref 5–15)
BUN: 15 mg/dL (ref 6–20)
CO2: 30 mmol/L (ref 22–32)
Calcium: 8.7 mg/dL — ABNORMAL LOW (ref 8.9–10.3)
Chloride: 103 mmol/L (ref 98–111)
Creatinine, Ser: 0.72 mg/dL (ref 0.44–1.00)
GFR, Estimated: >60 mL/min (ref >60)
Glucose, Bld: 113 mg/dL — ABNORMAL HIGH (ref 70–99)
Potassium: 3.8 mmol/L (ref 3.5–5.1)
Sodium: 139 mmol/L (ref 135–145)

## 2022-04-13 LAB — CBC
HCT: 38.8 % (ref 36.0–46.0)
Hemoglobin: 13.2 g/dL (ref 12.0–15.0)
MCH: 30.1 pg (ref 26.0–34.0)
MCHC: 34 g/dL (ref 30.0–36.0)
MCV: 88.6 fL (ref 80.0–100.0)
Platelets: 208 10*3/uL (ref 150–400)
RBC: 4.38 MIL/uL (ref 3.87–5.11)
RDW: 12.3 % (ref 11.5–15.5)
WBC: 5.9 10*3/uL (ref 4.0–10.5)
nRBC: 0 % (ref 0.0–0.2)

## 2022-04-13 LAB — TROPONIN I (HIGH SENSITIVITY)
Troponin I (High Sensitivity): <2 ng/L (ref <18)
Troponin I (High Sensitivity): <2 ng/L (ref <18)

## 2022-04-13 NOTE — ED Triage Notes (Signed)
Pt reports high blood pressure and indigestion that started this morning. It comes and goes.

## 2022-04-13 NOTE — ED Provider Triage Note (Signed)
Emergency Medicine Provider Triage Evaluation Note  Melissa Leonard , a 56 y.o. female  was evaluated in triage.  Pt requesting evaluation of high blood pressure and intermittent racing of her heart and possible indigestion.  States that she feels a pressure sensation to the middle of her chest and intermittent episodes in which she feels her heart is racing.  States that she went to her OB/GYN 1 month ago and was told she had a heart murmur.  She states this is a new diagnosis for her.  Has upcoming PCP appointment  but concerned because she has some dyspnea on exertion as well.  Pressure sensation of the middle chest began this morning.  She does not endorse feeling or need for belching.  Denies any abdominal pain, diaphoresis, arm neck or jaw pain.  No history o cardiac disease.    Review of Systems  Positive: Chest pressure, palpitations, dyspnea on exertion Negative: Ankle swelling, cough, fever  Physical Exam  BP (!) 161/97 (BP Location: Right Arm)   Pulse 77   Temp 98.4 F (36.9 C) (Oral)   Resp 14   Ht 5\' 10"  (1.778 m)   Wt 99.8 kg   SpO2 98%   BMI 31.57 kg/m  Gen:   Awake, no distress   Resp:  Normal effort, lungs clear to auscultation bilaterally MSK:   Moves extremities without difficulty  Other:  No peripheral edema  Medical Decision Making  Medically screening exam initiated at 5:03 PM.  Appropriate orders placed.  Melissa Leonard was informed that the remainder of the evaluation will be completed by another provider, this initial triage assessment does not replace that evaluation, and the importance of remaining in the ED until their evaluation is complete.     Kem Parkinson, PA-C 04/13/22 1716

## 2022-04-13 NOTE — ED Provider Notes (Signed)
[facility] EMERGENCY DEPARTMENT Provider Note  CSN: [id number] Arrival date & time: 04/13/22 1354  Chief Complaint(s) Gastroesophageal Reflux  HPI Melissa Leonard is a 56 y.o. female with history of hypertension, hypothyroidism presenting to the emergency department with chest pain.  Patient reports chest pain described as a pressure, comes and goes.  It is not clearly exertional.  Not pleuritic.  She reports sometimes associated having dyspnea on exertion.  No nausea, vomiting, diaphoresis, syncope, lightheadedness.  No recent travel or surgeries.  Symptoms have been present for around a month.  Symptoms worsened today which is why she came to the emergency department.  Past Medical History Past Medical History:  Diagnosis Date   Anxiety    Depression    Thyroid disease    There are no problems to display for this patient.  Home Medication(s) Prior to Admission medications   Medication Sig Start Date End Date Taking? Authorizing Provider  budesonide-formoterol (SYMBICORT) 160-4.5 MCG/ACT inhaler Inhale 2 puffs into the lungs 2 (two) times daily. 03/31/22   Volney American, PA-C  cefUROXime (CEFTIN) 500 MG tablet Take 1 tablet (500 mg total) by mouth 2 (two) times daily. 06/09/16   Frederich Cha, MD  diclofenac (VOLTAREN) 75 MG EC tablet Take 1 tablet (75 mg total) by mouth 2 (two) times daily. 03/03/14   Wallene Huh, DPM  estradiol (VIVELLE-DOT) 0.0375 MG/24HR Place 1 patch onto the skin 2 (two) times a week.    [provider]  levothyroxine (SYNTHROID, LEVOTHROID) 50 MCG tablet  02/24/14   [provider]  mupirocin ointment (BACTROBAN) 2 % Apply 1 application topically 3 (three) times daily. 06/09/16   Frederich Cha, MD  neomycin-polymyxin-hydrocortisone (CORTISPORIN) 3.5-10000-1 OTIC suspension Place 4 drops into the right ear 3 (three) times daily. 01/24/22   Hans Eden, NP  PARoxetine (PAXIL) 20 MG tablet  02/03/14   [provider]   progesterone (PROMETRIUM) 100 MG capsule Take 100 mg by mouth daily.    [provider]  promethazine-dextromethorphan (PROMETHAZINE-DM) 6.25-15 MG/5ML syrup Take 5 mLs by mouth 4 (four) times daily as needed. 03/31/22   Volney American, PA-C                                                                                                                                    Past Surgical History History reviewed. No pertinent surgical history. Family History Family History  Problem Relation Age of Onset   Prostate cancer Father    Breast cancer Neg Hx     Social History Social History   Tobacco Use   Smoking status: Never   Smokeless tobacco: Never  Vaping Use   Vaping Use: Never used  Substance Use Topics   Alcohol use: No   Drug use: No   Allergies Patient has no known allergies.  Review of Systems Review of Systems  All other systems reviewed and are negative.  Physical Exam Vital Signs  I have reviewed the triage vital signs BP (!) 164/84   Pulse 77   Temp (!) 97.4 F (36.3 C) (Oral)   Resp 18   Ht 5\' 10"  (1.778 m)   Wt 99.8 kg   SpO2 98%   BMI 31.57 kg/m  Physical Exam Vitals and nursing note reviewed.  Constitutional:      General: She is not in acute distress.    Appearance: She is well-developed.  HENT:     Head: Normocephalic and atraumatic.     Mouth/Throat:     Mouth: Mucous membranes are moist.  Eyes:     Pupils: Pupils are equal, round, and reactive to light.  Cardiovascular:     Rate and Rhythm: Normal rate and regular rhythm.     Heart sounds: No murmur heard. Pulmonary:     Effort: Pulmonary effort is normal. No respiratory distress.     Breath sounds: Normal breath sounds.  Abdominal:     General: Abdomen is flat.     Palpations: Abdomen is soft.     Tenderness: There is no abdominal tenderness.  Musculoskeletal:        General: No tenderness.     Right lower leg: No edema.     Left lower leg: No edema.  Skin:     General: Skin is warm and dry.  Neurological:     General: No focal deficit present.     Mental Status: She is alert. Mental status is at baseline.  Psychiatric:        Mood and Affect: Mood normal.        Behavior: Behavior normal.     ED Results and Treatments Labs (all labs ordered are listed, but only abnormal results are displayed) Labs Reviewed  BASIC METABOLIC PANEL - Abnormal; Notable for the following components:      Result Value   Glucose, Bld 113 (*)    Calcium 8.7 (*)    All other components within normal limits  CBC  TROPONIN I (HIGH SENSITIVITY)  TROPONIN I (HIGH SENSITIVITY)                                                                                                                          Radiology DG Chest 2 View  Result Date: 04/13/2022 CLINICAL DATA:  Chest pain EXAM: CHEST - 2 VIEW COMPARISON:  None Available. FINDINGS: Heart size and mediastinal contours are within normal limits. Lungs are clear. No pleural effusion or pneumothorax is seen. Mild degenerative spurring within the thoracic spine. No acute-appearing osseous abnormality. IMPRESSION: No active cardiopulmonary disease. No evidence of pneumonia or pulmonary edema. Electronically Signed   By: Franki Cabot M.D.   On: 04/13/2022 15:01    Pertinent labs & imaging results that were available during my care of the patient were reviewed by me and considered in my medical decision making (see MDM for details).  Medications Ordered in ED Medications - No data  to display                                                                                                                                   Procedures Procedures  (including critical care time)  Medical Decision Making / ED Course   MDM:  56 year old female presenting to the emergency department with chest pain.  Patient well-appearing, vital signs with hypertension.  EKG reassuring without acute ST or T wave changes concerning for  active ischemia.  Differential includes GERD, musculoskeletal chest wall pain, ACS or cardiac chest pain, pneumonia, pneumothorax, pulmonary embolism.  Suspect most likely cause would be reflux, she does report sometimes the pain is improved with Mylanta although today it did not improve.  Lower concern for ACS, patient does have some risk factors in terms of hypertension, obesity, family history of cardiac disease, and she does have some exertional symptoms, so will refer to cardiology for further work-up such as stress test.  Doubt pneumonia, pneumothorax with reassuring chest x-ray with no concerning symptoms.  Doubt pulmonary embolism, no recent surgery or travel, no tachycardia, hypoxia, shortness of breath at rest.  Doubt dissection given mild symptoms, ongoing for a month, reassuring chest x-ray. Will discharge patient to home. All questions answered. Patient comfortable with plan of discharge. Return precautions discussed with patient and specified on the after visit summary.         Additional history obtained: -Additional history obtained from spouse -External records from outside source obtained and reviewed including: Chart review including previous notes, labs, imaging, consultation notes including ED visit 03/31/22   Lab Tests: -I ordered, reviewed, and interpreted labs.   The pertinent results include:   Labs Reviewed  BASIC METABOLIC PANEL - Abnormal; Notable for the following components:      Result Value   Glucose, Bld 113 (*)    Calcium 8.7 (*)    All other components within normal limits  CBC  TROPONIN I (HIGH SENSITIVITY)  TROPONIN I (HIGH SENSITIVITY)    Notable for negative troponin  EKG   EKG Interpretation  Date/Time:  Sunday April 13 2022 14:06:32 EDT Ventricular Rate:  86 PR Interval:  196 QRS Duration: 82 QT Interval:  368 QTC Calculation: 440 R Axis:   53 Text Interpretation: Normal sinus rhythm Confirmed by Garnette Gunner [phone]) on 04/13/2022  7:04:36 PM         Imaging Studies ordered: I ordered imaging studies including CXR On my interpretation imaging demonstrates clear lungs I independently visualized and interpreted imaging. I agree with the radiologist interpretation   Medicines ordered and prescription drug management: No orders of the defined types were placed in this encounter.   -I have reviewed the patients home medicines and have made adjustments as needed  Social Determinants of Health:  Diagnosis or treatment significantly limited by social determinants of health: obesity  Co morbidities that complicate the patient evaluation  Past Medical History:  Diagnosis Date   Anxiety    Depression    Thyroid disease       Dispostion: Disposition decision including need for hospitalization was considered, and patient discharged from emergency department.    Final Clinical Impression(s) / ED Diagnoses Final diagnoses:  Atypical chest pain     This chart was dictated using voice recognition software.  Despite best efforts to proofread,  errors can occur which can change the documentation meaning.    Cristie Hem, MD 04/13/22 [phone]

## 2022-04-13 NOTE — Discharge Instructions (Signed)
We evaluated you for your chest pain.  Your cardiac enzymes were negative and your EKG was reassuring.  Your symptoms may be caused by indigestion.  However, given your age, medical history, and your shortness of breath with activity, we would recommend following up with cardiology.  We have placed a referral.  They should call you for a follow-up sometime this week.  Please return if you have any worsening symptoms, persistent symptoms, severe pain, nausea or vomiting, sweating, fainting or passing out, or any other concerning symptoms.

## 2022-04-21 ENCOUNTER — Ambulatory Visit
Admission: RE | Admit: 2022-04-21 | Discharge: 2022-04-21 | Disposition: A | Discharge status: Home / Self Care | Payer: BC Managed Care – PPO | Source: Ambulatory Visit | Attending: Obstetrics and Gynecology | Admitting: Obstetrics and Gynecology

## 2022-04-21 DIAGNOSIS — Z1231 Encounter for screening mammogram for malignant neoplasm of breast: Secondary | ICD-10-CM

## 2022-05-03 ENCOUNTER — Other Ambulatory Visit: Payer: Self-pay | Admitting: Family Medicine

## 2022-05-05 ENCOUNTER — Ambulatory Visit (INDEPENDENT_AMBULATORY_CARE_PROVIDER_SITE_OTHER): Payer: BC Managed Care – PPO

## 2022-05-05 ENCOUNTER — Other Ambulatory Visit
Admission: RE | Admit: 2022-05-05 | Discharge: 2022-05-05 | Disposition: A | Discharge status: Home / Self Care | Payer: BC Managed Care – PPO | Source: Ambulatory Visit | Attending: Internal Medicine | Admitting: Internal Medicine

## 2022-05-05 ENCOUNTER — Other Ambulatory Visit: Payer: Self-pay | Admitting: Internal Medicine

## 2022-05-05 ENCOUNTER — Ambulatory Visit: Payer: BC Managed Care – PPO | Admitting: Internal Medicine

## 2022-05-05 ENCOUNTER — Encounter: Payer: Self-pay | Admitting: Internal Medicine

## 2022-05-05 ENCOUNTER — Other Ambulatory Visit: Payer: Self-pay | Admitting: Family Medicine

## 2022-05-05 VITALS — BP 158/84 | HR 82 | Ht 70.0 in | Wt 223.0 lb

## 2022-05-05 DIAGNOSIS — R002 Palpitations: Secondary | ICD-10-CM

## 2022-05-05 DIAGNOSIS — R079 Chest pain, unspecified: Secondary | ICD-10-CM | POA: Diagnosis not present

## 2022-05-05 DIAGNOSIS — I10 Essential (primary) hypertension: Secondary | ICD-10-CM

## 2022-05-05 DIAGNOSIS — E039 Hypothyroidism, unspecified: Secondary | ICD-10-CM | POA: Diagnosis not present

## 2022-05-05 LAB — TSH: TSH: 1.999 u[IU]/mL (ref 0.350–4.500)

## 2022-05-05 MED ORDER — POTASSIUM CHLORIDE CRYS ER 20 MEQ PO TBCR: 20.0000 meq | EXTENDED_RELEASE_TABLET | Freq: Every day | ORAL | 3 refills | Status: DC

## 2022-05-05 MED ORDER — CHLORTHALIDONE 25 MG PO TABS: 25.0000 mg | ORAL_TABLET | Freq: Every day | ORAL | 3 refills | Status: DC

## 2022-05-05 NOTE — Progress Notes (Signed)
Cardiology Office Note  Date: 05/05/2022   ID: Melissa Leonard, DOB Jan 12, 1966, MRN [id number]  PCP:  Christeen Douglas, MD  Cardiologist:  None Electrophysiologist:  None   Reason for Office Visit: Evaluation of palpitations at the request of Dr Suezanne Jacquet.   History of Present Illness: Melissa Leonard is a 56 y.o. female known to have anxiety, depression, hypothyroidism was referred to the cardiology clinic for the evaluation of palpitations at the request of Dr Suezanne Jacquet.  Patient has been having palpitations since 05/23 to the point its occurring daily now. It worsened recently. She stated she gets daily palpitations, many times throughout the day. It does not affect her QOL but feels they are annoying. Not associated with dizziness, syncope, SOB or angina. No family Hx of premature ASCVD. Denied smoking cigarettes, alcohol use or drug use. Patient stated she checks her BP at home sometimes and it has been high. Not currently on any   Past Medical History:  Diagnosis Date   Anxiety    Depression    Thyroid disease     Past Surgical History:  Procedure Laterality Date   TONSILLECTOMY AND ADENOIDECTOMY     uterine ablation      Current Outpatient Medications  Medication Sig Dispense Refill   chlorthalidone (HYGROTON) 25 MG tablet Take 1 tablet (25 mg total) by mouth daily. 90 tablet 3   estradiol (VIVELLE-DOT) 0.0375 MG/24HR Place 1 patch onto the skin 2 (two) times a week.     levothyroxine (SYNTHROID, LEVOTHROID) 50 MCG tablet      PARoxetine (PAXIL) 20 MG tablet Take 20 mg by mouth daily.     potassium chloride SA (KLOR-CON M20) 20 MEQ tablet Take 1 tablet (20 mEq total) by mouth daily. 90 tablet 3   progesterone (PROMETRIUM) 100 MG capsule Take 100 mg by mouth daily.     vitamin D3 (CHOLECALCIFEROL) 25 MCG tablet Take 1,000 Units by mouth daily.     budesonide-formoterol (SYMBICORT) 160-4.5 MCG/ACT inhaler Inhale 2 puffs into the lungs 2 (two) times daily. (Patient  not taking: Reported on 05/05/2022) 1 each 0   cefUROXime (CEFTIN) 500 MG tablet Take 1 tablet (500 mg total) by mouth 2 (two) times daily. (Patient not taking: Reported on 05/05/2022) 20 tablet 0   diclofenac (VOLTAREN) 75 MG EC tablet Take 1 tablet (75 mg total) by mouth 2 (two) times daily. (Patient not taking: Reported on 05/05/2022) 50 tablet 2   mupirocin ointment (BACTROBAN) 2 % Apply 1 application topically 3 (three) times daily. (Patient not taking: Reported on 05/05/2022) 22 g 0   neomycin-polymyxin-hydrocortisone (CORTISPORIN) 3.5-10000-1 OTIC suspension Place 4 drops into the right ear 3 (three) times daily. (Patient not taking: Reported on 05/05/2022) 10 mL 0   promethazine-dextromethorphan (PROMETHAZINE-DM) 6.25-15 MG/5ML syrup Take 5 mLs by mouth 4 (four) times daily as needed. (Patient not taking: Reported on 05/05/2022) 100 mL 0   Current Facility-Administered Medications  Medication Dose Route Frequency Provider Last Rate Last Admin   triamcinolone acetonide (KENALOG) 10 MG/ML injection 10 mg  10 mg Other Once Lenn Sink, DPM       Allergies:  Patient has no known allergies.   Social History: The patient  reports that she has never smoked. She has never used smokeless tobacco. She reports that she does not drink alcohol and does not use drugs.   Family History: The patient's family history includes Prostate cancer in her father.   ROS:  Please see the history of present illness.  Otherwise, complete review of systems is positive for none.  All other systems are reviewed and negative.   Physical Exam: VS:  BP (!) 158/84   Pulse 82   Ht 5\' 10"  (1.778 m)   Wt 223 lb (101.2 kg)   SpO2 96%   BMI 32.00 kg/m , BMI Body mass index is 32 kg/m.  Wt Readings from Last 3 Encounters:  05/05/22 223 lb (101.2 kg)  04/13/22 220 lb (99.8 kg)  06/09/16 204 lb (92.5 kg)    General: Patient appears comfortable at rest. HEENT: Conjunctiva and lids normal, oropharynx clear with  moist mucosa. Neck: Supple, no elevated JVP or carotid bruits, no thyromegaly. Lungs: Clear to auscultation, nonlabored breathing at rest. Cardiac: Regular rate and rhythm, no S3 or significant systolic murmur, no pericardial rub. Abdomen: Soft, nontender, no hepatomegaly, bowel sounds present, no guarding or rebound. Extremities: No pitting edema, distal pulses 2+. Skin: Warm and dry. Musculoskeletal: No kyphosis. Neuropsychiatric: Alert and oriented x3, affect grossly appropriate.  ECG:  An ECG dated   was personally reviewed today and demonstrated:  NSR  Recent Labwork: 04/13/2022: BUN 15; Creatinine, Ser 0.72; Hemoglobin 13.2; Platelets 208; Potassium 3.8; Sodium 139  No results found for: "CHOL", "TRIG", "HDL", "CHOLHDL", "VLDL", "LDLCALC", "LDLDIRECT"  Other Studies Reviewed Today: Labs  Assessment and Plan: Patient is a 56 y/o F known to have anxiety, depression, hypothyroidism presented to the cardiology clinic for evaluation of palpitations.  #Palpitations -Obtain TSH -One week event monitor  #New diagnosis of HTN -Start Chlorthalidone 25mg  once daily -K supplements 20 mEq once daily   I have spent a total of 45 minutes with patient reviewing chart, EKGs, labs and examining patient as well as establishing an assessment and plan that was discussed with the patient.  > 50% of time was spent in direct patient care.      Medication Adjustments/Labs and Tests Ordered: Current medicines are reviewed at length with the patient today.  Concerns regarding medicines are outlined above.   Tests Ordered: Orders Placed This Encounter  Procedures   TSH    Medication Changes: Meds ordered this encounter  Medications   chlorthalidone (HYGROTON) 25 MG tablet    Sig: Take 1 tablet (25 mg total) by mouth daily.    Dispense:  90 tablet    Refill:  3   potassium chloride SA (KLOR-CON M20) 20 MEQ tablet    Sig: Take 1 tablet (20 mEq total) by mouth daily.    Dispense:  90  tablet    Refill:  3    Disposition:  Follow up  6 weeks  Signed, Terell Kincy 59, MD, 05/05/2022 12:11 PM    [facility] at [facility] [address] [place], [address] [place]

## 2022-05-05 NOTE — Patient Instructions (Addendum)
Medication Instructions:  Your physician has recommended you make the following change in your medication:  -Start Chlorthalidone 25 mg tablets daily -Start Klor Con 20 mEq tablets daily   Labwork: TSH  Testing/Procedures: [facility]io  Follow-Up: Follow up with Dr. Jenene Slicker in 6 weeks.   Any Other Special Instructions Will Be Listed Below (If Applicable).     If you need a refill on your cardiac medications before your next appointment, please call your pharmacy.

## 2022-05-05 NOTE — Telephone Encounter (Signed)
Unable to refill per protocol, last refill by another provider. Provider not at this practice. Will refuse request. Requested Prescriptions  Pending Prescriptions Disp Refills   SYMBICORT 160-4.5 MCG/ACT inhaler [Pharmacy Med Name: SYMBICORT 160-4.5 MCG/ACT INH AERO] 10.2 g     Sig: INHALE 2 PUFFS INTO LUNGS TWICE DAILY     There is no refill protocol information for this order

## 2022-05-06 NOTE — Telephone Encounter (Signed)
Requested Prescriptions  Pending Prescriptions Disp Refills   SYMBICORT 160-4.5 MCG/ACT inhaler [Pharmacy Med Name: SYMBICORT 160-4.5 MCG/ACT INH AERO] 10.2 g     Sig: INHALE 2 PUFFS INTO LUNGS TWICE DAILY     There is no refill protocol information for this order

## 2022-05-07 DIAGNOSIS — R002 Palpitations: Secondary | ICD-10-CM | POA: Diagnosis not present

## 2022-05-12 ENCOUNTER — Telehealth: Payer: Self-pay

## 2022-05-12 NOTE — Telephone Encounter (Signed)
Patient notified and verbalized understanding. Patient had no questions or concerns at this time. PCP copied 

## 2022-05-12 NOTE — Telephone Encounter (Signed)
-----   Message from Marjo Bicker, MD sent at 05/09/2022  4:31 PM EST ----- Normal TSH levels.

## 2022-05-23 ENCOUNTER — Telehealth: Payer: Self-pay | Admitting: Internal Medicine

## 2022-05-23 NOTE — Telephone Encounter (Signed)
Patient is requesting a call back to discuss monitor results. 

## 2022-06-11 ENCOUNTER — Encounter: Payer: Self-pay | Admitting: Internal Medicine

## 2022-06-12 ENCOUNTER — Other Ambulatory Visit: Payer: Self-pay

## 2022-06-12 ENCOUNTER — Encounter: Payer: Self-pay | Admitting: Internal Medicine

## 2022-06-12 MED ORDER — METOPROLOL TARTRATE 25 MG PO TABS: 25.0000 mg | ORAL_TABLET | Freq: Two times a day (BID) | ORAL | 3 refills | Status: DC

## 2022-06-24 ENCOUNTER — Ambulatory Visit: Payer: BC Managed Care – PPO | Admitting: Internal Medicine

## 2022-06-26 ENCOUNTER — Encounter: Payer: Self-pay | Admitting: Internal Medicine

## 2022-06-26 ENCOUNTER — Other Ambulatory Visit: Payer: Self-pay

## 2022-06-26 MED ORDER — DILTIAZEM HCL 30 MG PO TABS: 30.0000 mg | ORAL_TABLET | Freq: Four times a day (QID) | ORAL | 0 refills | Status: DC

## 2022-07-02 ENCOUNTER — Ambulatory Visit
Admission: RE | Admit: 2022-07-02 | Discharge: 2022-07-02 | Disposition: A | Discharge status: Home / Self Care | Payer: BC Managed Care – PPO | Source: Ambulatory Visit | Attending: Nurse Practitioner | Admitting: Nurse Practitioner

## 2022-07-02 VITALS — BP 129/88 | HR 80 | Temp 98.6°F | Resp 16

## 2022-07-02 DIAGNOSIS — J029 Acute pharyngitis, unspecified: Secondary | ICD-10-CM | POA: Diagnosis not present

## 2022-07-02 LAB — POCT RAPID STREP A (OFFICE): Rapid Strep A Screen: NEGATIVE

## 2022-07-02 NOTE — ED Provider Notes (Signed)
[facility]    CSN: [id number] Arrival date & time: 07/02/22  4098      History   Chief Complaint Chief Complaint  Patient presents with   Sore Throat    Entered by patient   Appointment    1000    HPI Melissa Leonard is a 57 y.o. female.   Patient presents today for 3 to 4-day history of postnasal drainage, sore throat on the left side, left ear pain without drainage, and fatigue.  Reports she has been a little bit less hungry because it is uncomfortable to swallow.  She denies fever, body aches or chills.  No cough, shortness of breath, chest pain, nasal congestion, chest congestion, or runny nose.  No headache, abdominal pain, nausea/vomiting, diarrhea, or new rash.  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  Nobody has been sick that she knows of.  Reports she takes Advil and it helps with the sore throat.  Reports at age 6, she had her tonsils removed for recurrent strep throat.  She denies issues since that time with recurrent strep throat.    Past Medical History:  Diagnosis Date   Anxiety    Depression    Thyroid disease     Patient Active Problem List   Diagnosis Date Noted   Palpitations 05/05/2022   HTN (hypertension), benign 05/05/2022    Past Surgical History:  Procedure Laterality Date   TONSILLECTOMY AND ADENOIDECTOMY     uterine ablation      OB History   No obstetric history on file.      Home Medications    Prior to Admission medications   Medication Sig Start Date End Date Taking? Authorizing Provider  diltiazem (CARDIZEM) 30 MG tablet Take 1 tablet (30 mg total) by mouth every 6 (six) hours. 06/26/22   Mallipeddi, Vishnu P, MD  chlorthalidone (HYGROTON) 25 MG tablet Take 1 tablet (25 mg total) by mouth daily. 05/05/22 04/30/23  Mallipeddi, Vishnu P, MD  estradiol (VIVELLE-DOT) 0.0375 MG/24HR Place 1 patch onto the skin 2 (two) times a week.     [provider]  levothyroxine (SYNTHROID, LEVOTHROID) 50 MCG tablet  02/24/14   [provider]  PARoxetine (PAXIL) 20 MG tablet Take 20 mg by mouth daily. 02/03/14   [provider]  potassium chloride SA (KLOR-CON M20) 20 MEQ tablet Take 1 tablet (20 mEq total) by mouth daily. 05/05/22 04/30/23  Mallipeddi, Vishnu P, MD  progesterone (PROMETRIUM) 100 MG capsule Take 100 mg by mouth daily.    [provider]  vitamin D3 (CHOLECALCIFEROL) 25 MCG tablet Take 1,000 Units by mouth daily.    [provider]    Family History Family History  Problem Relation Age of Onset   Prostate cancer Father    Breast cancer Neg Hx     Social History Social History   Tobacco Use   Smoking status: Never   Smokeless tobacco: Never  Vaping Use   Vaping Use: Never used  Substance Use Topics   Alcohol use: No   Drug use: No     Allergies   Patient has no known allergies.   Review of Systems Review of Systems Per HPI  Physical Exam Triage Vital Signs ED Triage Vitals  Enc Vitals Group     BP 07/02/22 1034 129/88     Pulse Rate 07/02/22 1034 80     Resp 07/02/22 1034 16  Temp 07/02/22 1034 98.6 F (37 C)     Temp Source 07/02/22 1034 Oral     SpO2 07/02/22 1034 95 %     Weight --      Height --      Head Circumference --      Peak Flow --      Pain Score 07/02/22 1037 6     Pain Loc --      Pain Edu? --      Excl. in [place]? --    No data found.  Updated Vital Signs BP 129/88 (BP Location: Right Arm)   Pulse 80   Temp 98.6 F (37 C) (Oral)   Resp 16   SpO2 95%   Visual Acuity Right Eye Distance:   Left Eye Distance:   Bilateral Distance:    Right Eye Near:   Left Eye Near:    Bilateral Near:     Physical Exam Vitals and nursing note reviewed.  Constitutional:      General: She is not in acute distress.    Appearance: Normal appearance. She is not ill-appearing or toxic-appearing.  HENT:     Head: Normocephalic and  atraumatic.     Right Ear: Tympanic membrane, ear canal and external ear normal. No drainage, swelling or tenderness. No middle ear effusion. Tympanic membrane is not erythematous.     Left Ear: Ear canal and external ear normal. No drainage, swelling or tenderness. A middle ear effusion is present. Tympanic membrane is not erythematous.     Nose: No congestion or rhinorrhea.     Mouth/Throat:     Mouth: Mucous membranes are moist.     Pharynx: Oropharynx is clear. Posterior oropharyngeal erythema present. No oropharyngeal exudate.     Tonsils: No tonsillar exudate or tonsillar abscesses. 0 on the right. 0 on the left.  Eyes:     General: No scleral icterus.    Extraocular Movements: Extraocular movements intact.  Cardiovascular:     Rate and Rhythm: Normal rate and regular rhythm.  Pulmonary:     Effort: Pulmonary effort is normal. No respiratory distress.     Breath sounds: Normal breath sounds. No wheezing, rhonchi or rales.  Abdominal:     General: Abdomen is flat. Bowel sounds are normal. There is no distension.     Palpations: Abdomen is soft.  Musculoskeletal:     Cervical back: Normal range of motion and neck supple.  Lymphadenopathy:     Cervical: No cervical adenopathy.  Skin:    General: Skin is warm and dry.     Coloration: Skin is not jaundiced or pale.     Findings: No erythema or rash.  Neurological:     Mental Status: She is alert and oriented to person, place, and time.     Motor: No weakness.  Psychiatric:        Mood and Affect: Mood normal.        Behavior: Behavior normal.      UC Treatments / Results  Labs (all labs ordered are listed, but only abnormal results are displayed) Labs Reviewed  POCT RAPID STREP A (OFFICE)    EKG   Radiology No results found.  Procedures Procedures (including critical care time)  Medications Ordered in UC Medications - No data to display  Initial Impression / Assessment and Plan / UC Course  I have reviewed  the triage vital signs and the nursing notes.  Pertinent labs & imaging results that were available during my  care of the patient were reviewed by me and considered in my medical decision making (see chart for details).   Patient is well-appearing, normotensive, afebrile, not tachycardic, not tachypneic, oxygenating well on room air.    Acute pharyngitis, unspecified etiology Rapid strep test is negative today Given symptoms, examination, throat culture deferred Suspect viral etiology and explained this to patient  Patient declines COVID-19 testing Supportive care discussed including started Flonase, saline rinses, continue Advil as needed ER and return precautions discussed   The patient was given the opportunity to ask questions.  All questions answered to their satisfaction.  The patient is in agreement to this plan.    Final Clinical Impressions(s) / UC Diagnoses   Final diagnoses:  Acute pharyngitis, unspecified etiology     Discharge Instructions      You have a viral sore throat.  Symptoms should improve over the next week to 10 days.  If you develop chest pain or shortness of breath, go to the emergency room.  Some things that can make you feel better are: - Increased rest - Increasing fluid with water/sugar free electrolytes - Acetaminophen and ibuprofen as needed for fever/pain - Salt water gargling, chloraseptic spray and throat lozenges for sore throat     ED Prescriptions   None    PDMP not reviewed this encounter.   Valentino Nose, NP 07/02/22 1059

## 2022-07-02 NOTE — ED Triage Notes (Signed)
Pt reports sore throat, chills, fatigue x 3 days

## 2022-07-02 NOTE — Discharge Instructions (Signed)
You have a viral sore throat.  Symptoms should improve over the next week to 10 days.  If you develop chest pain or shortness of breath, go to the emergency room.  Some things that can make you feel better are: - Increased rest - Increasing fluid with water/sugar free electrolytes - Acetaminophen and ibuprofen as needed for fever/pain - Salt water gargling, chloraseptic spray and throat lozenges for sore throat

## 2022-07-08 ENCOUNTER — Other Ambulatory Visit: Payer: Self-pay | Admitting: Internal Medicine

## 2022-07-08 ENCOUNTER — Other Ambulatory Visit: Payer: Self-pay

## 2022-07-08 MED ORDER — DILTIAZEM HCL 60 MG PO TABS: 60.0000 mg | ORAL_TABLET | Freq: Four times a day (QID) | ORAL | 1 refills | Status: DC

## 2022-07-09 ENCOUNTER — Other Ambulatory Visit: Payer: Self-pay

## 2022-07-09 MED ORDER — DILTIAZEM HCL 60 MG PO TABS: 60.0000 mg | ORAL_TABLET | Freq: Four times a day (QID) | ORAL | 1 refills | Status: DC

## 2022-07-23 ENCOUNTER — Encounter: Payer: Self-pay | Admitting: Internal Medicine

## 2022-07-23 ENCOUNTER — Ambulatory Visit: Payer: BC Managed Care – PPO | Attending: Internal Medicine | Admitting: Internal Medicine

## 2022-07-23 VITALS — BP 130/62 | HR 78 | Ht 69.5 in | Wt 223.6 lb

## 2022-07-23 DIAGNOSIS — R0602 Shortness of breath: Secondary | ICD-10-CM | POA: Diagnosis not present

## 2022-07-23 DIAGNOSIS — I493 Ventricular premature depolarization: Secondary | ICD-10-CM | POA: Diagnosis not present

## 2022-07-23 MED ORDER — DILTIAZEM HCL ER COATED BEADS 360 MG PO CP24: 360.0000 mg | ORAL_CAPSULE | Freq: Every day | ORAL | 3 refills | Status: DC

## 2022-07-23 NOTE — Patient Instructions (Signed)
Medication Instructions:   Stop Taking Diltiazem 60 mg   Start Taking Diltiazem 360 mg Daily   *If you need a refill on your cardiac medications before your next appointment, please call your pharmacy*   Lab Work: NONE   If you have labs (blood work) drawn today and your tests are completely normal, you will receive your results only by: [place] (if you have MyChart) OR A paper copy in the mail If you have any lab test that is abnormal or we need to change your treatment, we will call you to review the results.   Testing/Procedures:  Your physician has requested that you have an echocardiogram. Echocardiography is a painless test that uses sound waves to create images of your heart. It provides your doctor with information about the size and shape of your heart and how well your heart's chambers and valves are working. This procedure takes approximately one hour. There are no restrictions for this procedure. Please do NOT wear cologne, perfume, aftershave, or lotions (deodorant is allowed). Please arrive 15 minutes prior to your appointment time.  Calcium Score Ct   Follow-Up: At [facility], you and your health needs are our priority.  As part of our continuing mission to provide you with exceptional heart care, we have created designated Provider Care Teams.  These Care Teams include your primary Cardiologist (physician) and Advanced Practice Providers (APPs -  Physician Assistants and Nurse Practitioners) who all work together to provide you with the care you need, when you need it.  We recommend signing up for the patient portal called "MyChart".  Sign up information is provided on this After Visit Summary.  MyChart is used to connect with patients for Virtual Visits (Telemedicine).  Patients are able to view lab/test results, encounter notes, upcoming appointments, etc.  Non-urgent messages can be sent to your provider as well.   To learn more about what you can  do with MyChart, go to [email or web address].    Your next appointment:   1 year(s)  Provider:   Claudina Lick, MD    Other Instructions Thank you for choosing [facility]!

## 2022-07-23 NOTE — Progress Notes (Signed)
Cardiology Office Note  Date: 07/23/2022   ID: Melissa Leonard, DOB [birth year], MRN [id number]  PCP:  Benjaman Kindler, MD  Cardiologist:  None Electrophysiologist:  None   Reason for Office Visit: Follow-up of palpitations from frequent PVC burden   History of Present Illness: Melissa Leonard is a 57 y.o. female known to have anxiety, depression, hypothyroidism presented to cardiology clinic for follow-up of palpitations/frequent PVCs.  Patient was initially referred to cardiology clinic in 04/2022 for evaluation of palpitations.  She underwent event monitor which showed frequent PVCs, 7.9% burden and symptomatic due to which metoprolol tartrate 25 mg twice daily was started which improved her symptoms significantly but noticed extreme fatigue due to which metoprolol was switched to diltiazem 60 mg every 6 hours. Patient presents today for follow-up visit.  Her symptoms are controlled with diltiazem 60 mg every 6 hours but she continues to have palpitations/ skipped beats after 2 to 3 hours of taking the medication.  Sometimes, she also has SOB due to these palpitations. Denies any lightheadedness, dizziness, syncope, leg swelling.  Past Medical History:  Diagnosis Date   Anxiety    Depression    Thyroid disease     Past Surgical History:  Procedure Laterality Date   TONSILLECTOMY AND ADENOIDECTOMY     uterine ablation      Current Outpatient Medications  Medication Sig Dispense Refill   chlorthalidone (HYGROTON) 25 MG tablet Take 1 tablet (25 mg total) by mouth daily. 90 tablet 3   diltiazem (CARDIZEM) 60 MG tablet Take 1 tablet (60 mg total) by mouth every 6 (six) hours. 360 tablet 1   estradiol (VIVELLE-DOT) 0.0375 MG/24HR Place 1 patch onto the skin 2 (two) times a week.     levothyroxine (SYNTHROID, LEVOTHROID) 50 MCG tablet      PARoxetine (PAXIL) 20 MG tablet Take 20 mg by mouth daily.     potassium chloride SA (KLOR-CON M20) 20 MEQ tablet Take 1 tablet (20 mEq  total) by mouth daily. 90 tablet 3   progesterone (PROMETRIUM) 100 MG capsule Take 100 mg by mouth daily.     vitamin D3 (CHOLECALCIFEROL) 25 MCG tablet Take 1,000 Units by mouth daily.     No current facility-administered medications for this visit.   Allergies:  Patient has no known allergies.   Social History: The patient  reports that she has never smoked. She has never used smokeless tobacco. She reports that she does not drink alcohol and does not use drugs.   Family History: The patient's family history includes Prostate cancer in her father.   ROS:  Please see the history of present illness. Otherwise, complete review of systems is positive for none.  All other systems are reviewed and negative.   Physical Exam: VS:  Ht 5' 9.5" (1.765 m)   Wt 223 lb 9.6 oz (101.4 kg)   BMI 32.55 kg/m , BMI Body mass index is 32.55 kg/m.  Wt Readings from Last 3 Encounters:  07/23/22 223 lb 9.6 oz (101.4 kg)  05/05/22 223 lb (101.2 kg)  04/13/22 220 lb (99.8 kg)    General: Patient appears comfortable at rest. HEENT: Conjunctiva and lids normal, oropharynx clear with moist mucosa. Neck: Supple, no elevated JVP or carotid bruits, no thyromegaly. Lungs: Clear to auscultation, nonlabored breathing at rest. Cardiac: Regular rate and rhythm, no S3 or significant systolic murmur, no pericardial rub. Abdomen: Soft, nontender, no hepatomegaly, bowel sounds present, no guarding or rebound. Extremities: No pitting edema, distal pulses 2+.  Skin: Warm and dry. Musculoskeletal: No kyphosis. Neuropsychiatric: Alert and oriented x3, affect grossly appropriate.  ECG:  An ECG dated 04/13/2022 was personally reviewed today and demonstrated:  NSR and no ST-T changes  Recent Labwork: 04/13/2022: BUN 15; Creatinine, Ser 0.72; Hemoglobin 13.2; Platelets 208; Potassium 3.8; Sodium 139 05/05/2022: TSH 1.999  No results found for: "CHOL", "TRIG", "HDL", "CHOLHDL", "VLDL", "LDLCALC", "LDLDIRECT"  Other  Studies Reviewed Today: Event monitor from 05/2022 PVC burden 7.9%  Assessment and Plan: Patient is a 57 y/o F known to have anxiety, depression, hypothyroidism presented to the cardiology clinic for evaluation of palpitations.  #Palpitations likely secondary to 7.9% PVC burden -Patient was previously started on metoprolol tartrate 25 mg twice daily with symptom relief however noticed significant fatigue due to which metoprolol was switched to diltiazem 60 mg every 6 hours. She has a symptom-free interval of 2 to 3 hours after taking diltiazem 60 mg every 6 hours. Will switch from short acting diltiazem 60 mg Q6h to long-acting diltiazem 360 mg once daily. -Obtain 2D echocardiogram and CT calcium scoring test  # HTN controlled -Continue chlorthalidone 25 mg once daily -Continue potassium supplements 20 mEq once daily   I have spent a total of 33 minutes with patient reviewing chart, EKGs, labs and examining patient as well as establishing an assessment and plan that was discussed with the patient.  > 50% of time was spent in direct patient care.      Medication Adjustments/Labs and Tests Ordered: Current medicines are reviewed at length with the patient today.  Concerns regarding medicines are outlined above.   Tests Ordered: Orders Placed This Encounter  Procedures   CT CARDIAC SCORING (SELF PAY ONLY)   ECHOCARDIOGRAM COMPLETE     Medication Changes: Diltiazem 360 mg once daily D/c Diltiazem 60 mg Q6h   Disposition:  Follow up  one year  Signed, Tikisha Molinaro Fidel Levy, MD, 07/23/2022 8:53 AM    [facility] [place]. [address] [place], [place] [address]

## 2022-08-27 ENCOUNTER — Ambulatory Visit (HOSPITAL_COMMUNITY)
Admission: RE | Admit: 2022-08-27 | Discharge: 2022-08-27 | Disposition: A | Discharge status: Home / Self Care | Payer: BC Managed Care – PPO | Source: Ambulatory Visit | Attending: Internal Medicine | Admitting: Internal Medicine

## 2022-08-27 DIAGNOSIS — I493 Ventricular premature depolarization: Secondary | ICD-10-CM | POA: Insufficient documentation

## 2022-08-27 DIAGNOSIS — R0602 Shortness of breath: Secondary | ICD-10-CM | POA: Insufficient documentation

## 2022-08-27 LAB — ECHOCARDIOGRAM COMPLETE
AR max vel: 2.75 cm2
AV Area VTI: 2.27 cm2
AV Area mean vel: 2.38 cm2
AV Mean grad: 4 mmHg
AV Peak grad: 7.2 mmHg
Ao pk vel: 1.34 m/s
Area-P 1/2: 3.21 cm2
S' Lateral: 2.7 cm

## 2022-08-27 NOTE — Progress Notes (Signed)
*  PRELIMINARY RESULTS* Echocardiogram 2D Echocardiogram has been performed.  Melissa Leonard 08/27/2022, 1:39 PM

## 2022-08-28 ENCOUNTER — Encounter: Payer: Self-pay | Admitting: Internal Medicine

## 2022-09-01 ENCOUNTER — Other Ambulatory Visit: Payer: Self-pay

## 2022-09-01 ENCOUNTER — Telehealth: Payer: Self-pay

## 2022-09-01 ENCOUNTER — Encounter: Payer: Self-pay | Admitting: Internal Medicine

## 2022-09-01 DIAGNOSIS — I493 Ventricular premature depolarization: Secondary | ICD-10-CM

## 2022-09-01 MED ORDER — ROSUVASTATIN CALCIUM 5 MG PO TABS: 5.0000 mg | ORAL_TABLET | Freq: Every day | ORAL | 3 refills | Status: DC

## 2022-09-01 NOTE — Telephone Encounter (Signed)
Patient notified via mychart

## 2022-09-01 NOTE — Telephone Encounter (Signed)
-----   Message from Chalmers Guest, MD sent at 09/01/2022  3:32 PM EDT ----- Coronary calcium score is 39.8 (more than 400 is considered to be significant). Start moderate intensity statin, rosuvastatin 5 mg nightly.

## 2022-09-19 ENCOUNTER — Encounter: Payer: Self-pay | Admitting: Internal Medicine

## 2022-09-19 NOTE — Telephone Encounter (Signed)
error 

## 2022-10-27 ENCOUNTER — Ambulatory Visit: Payer: BC Managed Care – PPO | Admitting: Internal Medicine

## 2022-10-29 ENCOUNTER — Telehealth: Payer: Self-pay | Admitting: Internal Medicine

## 2022-10-29 ENCOUNTER — Encounter: Payer: Self-pay | Admitting: Internal Medicine

## 2022-10-29 ENCOUNTER — Ambulatory Visit: Payer: BC Managed Care – PPO | Attending: Internal Medicine | Admitting: Internal Medicine

## 2022-10-29 VITALS — BP 126/94 | HR 81 | Ht 69.5 in | Wt 220.0 lb

## 2022-10-29 DIAGNOSIS — I493 Ventricular premature depolarization: Secondary | ICD-10-CM | POA: Diagnosis not present

## 2022-10-29 MED ORDER — FLECAINIDE ACETATE 50 MG PO TABS: 50.0000 mg | ORAL_TABLET | Freq: Two times a day (BID) | ORAL | 3 refills | Status: DC

## 2022-10-29 NOTE — Progress Notes (Signed)
HPI Melissa Leonard is referred by Dr. Jenene Slicker for evaluation of PVC's. She is a pleasant 57 yo woman who has been bothered by palpitations for almost a year. She has not had syncope. She tried a beta blocker but had side effects. Cardizem was tolerated but not effective. She has dyspnea. She admits to dietary indiscretion. She wore a monitor and had 7.9% PVC's. She notes that she is going on an 8 day cruise.  No Known Allergies   Current Outpatient Medications  Medication Sig Dispense Refill   chlorthalidone (HYGROTON) 25 MG tablet Take 1 tablet (25 mg total) by mouth daily. 90 tablet 3   diltiazem (CARDIZEM CD) 360 MG 24 hr capsule Take 1 capsule (360 mg total) by mouth daily. 90 capsule 3   Ferrous Sulfate (IRON PO) See admin instructions.     levothyroxine (SYNTHROID, LEVOTHROID) 50 MCG tablet      PARoxetine (PAXIL) 20 MG tablet Take 20 mg by mouth daily.     potassium chloride SA (KLOR-CON M20) 20 MEQ tablet Take 1 tablet (20 mEq total) by mouth daily. 90 tablet 3   progesterone (PROMETRIUM) 100 MG capsule Take 100 mg by mouth daily.     vitamin D3 (CHOLECALCIFEROL) 25 MCG tablet Take 1,000 Units by mouth daily.     estradiol (VIVELLE-DOT) 0.075 MG/24HR 1 patch 2 (two) times a week.     rosuvastatin (CRESTOR) 5 MG tablet Take 1 tablet (5 mg total) by mouth daily. (Patient not taking: Reported on 10/29/2022) 90 tablet 3   No current facility-administered medications for this visit.     Past Medical History:  Diagnosis Date   Anxiety    Depression    Thyroid disease     ROS:   All systems reviewed and negative except as noted in the HPI.   Past Surgical History:  Procedure Laterality Date   TONSILLECTOMY AND ADENOIDECTOMY     uterine ablation       Family History  Problem Relation Age of Onset   Prostate cancer Father    Breast cancer Neg Hx      Social History   Socioeconomic History   Marital status: Married    Spouse name: Not on file   Number of  children: Not on file   Years of education: Not on file   Highest education level: Not on file  Occupational History   Not on file  Tobacco Use   Smoking status: Never   Smokeless tobacco: Never  Vaping Use   Vaping Use: Never used  Substance and Sexual Activity   Alcohol use: No   Drug use: No   Sexual activity: Yes  Other Topics Concern   Not on file  Social History Narrative   Not on file   Social Determinants of Health   Financial Resource Strain: Not on file  Food Insecurity: Not on file  Transportation Needs: Not on file  Physical Activity: Not on file  Stress: Not on file  Social Connections: Not on file  Intimate Partner Violence: Not on file     BP (!) 126/94   Pulse 81   Ht 5' 9.5" (1.765 m)   Wt 220 lb (99.8 kg)   SpO2 97%   BMI 32.02 kg/m   Physical Exam:  Well appearing NAD HEENT: Unremarkable Neck:  No JVD, no thyromegally Lymphatics:  No adenopathy Back:  No CVA tenderness Lungs:  Clear with no wheezes HEART:  Regular rate rhythm, no murmurs, no  rubs, no clicks Abd:  soft, positive bowel sounds, no organomegally, no rebound, no guarding Ext:  2 plus pulses, no edema, no cyanosis, no clubbing Skin:  No rashes no nodules Neuro:  CN II through XII intact, motor grossly intact  EKG - reviewed. NSR  Assess/Plan: Symptomatic PVC's - she is not having enough ectopy to recommend ablation. I have offered her low dose flecainide. If her symptoms are controlled we will wean off the cardizem. I encouraged her to avoid caffeine and ETOH.   Sharlot Gowda Barbette Mcglaun,MD

## 2022-10-29 NOTE — Telephone Encounter (Signed)
Left a message for patient to call office back regarding nurse appointment.  MyChart message sent.

## 2022-10-29 NOTE — Telephone Encounter (Signed)
Patient states she will need to get an EKG in 4 weeks and will need to reschedule her Nurse's visit on 6/5.

## 2022-10-29 NOTE — Patient Instructions (Signed)
Medication Instructions:   Start Flecainide 50 mg Two Times Daily in 2 weeks after returning from cruise.   Your physician recommends that you schedule a follow-up appointment in: 4 Weeks for EKG    *If you need a refill on your cardiac medications before your next appointment, please call your pharmacy*   Lab Work: NONE   If you have labs (blood work) drawn today and your tests are completely normal, you will receive your results only by: MyChart Message (if you have MyChart) OR A paper copy in the mail If you have any lab test that is abnormal or we need to change your treatment, we will call you to review the results.   Testing/Procedures: NONE     Follow-Up: At [facility], you and your health needs are our priority.  As part of our continuing mission to provide you with exceptional heart care, we have created designated Provider Care Teams.  These Care Teams include your primary Cardiologist (physician) and Advanced Practice Providers (APPs -  Physician Assistants and Nurse Practitioners) who all work together to provide you with the care you need, when you need it.  We recommend signing up for the patient portal called "MyChart".  Sign up information is provided on this After Visit Summary.  MyChart is used to connect with patients for Virtual Visits (Telemedicine).  Patients are able to view lab/test results, encounter notes, upcoming appointments, etc.  Non-urgent messages can be sent to your provider as well.   To learn more about what you can do with MyChart, go to [email or web address].    Your next appointment:   4 month(s)  Provider:   Lewayne Bunting, MD    Other Instructions Thank you for choosing [facility]!

## 2022-11-26 ENCOUNTER — Ambulatory Visit: Payer: BC Managed Care – PPO | Attending: Cardiology

## 2022-11-26 ENCOUNTER — Telehealth: Payer: Self-pay

## 2022-11-26 VITALS — HR 72

## 2022-11-26 DIAGNOSIS — Z79899 Other long term (current) drug therapy: Secondary | ICD-10-CM

## 2022-11-26 DIAGNOSIS — Z5181 Encounter for therapeutic drug level monitoring: Secondary | ICD-10-CM

## 2022-11-26 MED ORDER — POTASSIUM CHLORIDE CRYS ER 20 MEQ PO TBCR: 40.0000 meq | EXTENDED_RELEASE_TABLET | Freq: Every day | ORAL | 3 refills | Status: DC

## 2022-11-26 NOTE — Telephone Encounter (Signed)
-----   Message from Marjo Bicker, MD sent at 11/26/2022  4:48 PM EDT ----- Regarding: Serum K 3.1 Serum K 3.1. Increase Kcl from 20 to 40 mEq once daily.

## 2022-11-26 NOTE — Telephone Encounter (Signed)
MyChart message sent to patient regarding increase in potassium.

## 2022-11-26 NOTE — Progress Notes (Signed)
EKG after starting Flecainide 50 mg bid on 10/29/22  Patient tells staff that she feels better regarding less palpitations but reports her endocrinologist did lab work and found potassium of 3.1   Labs scanned to Dr.Mallipeddi for review and advice.   EKG NSR with one PVC noted.

## 2023-03-03 ENCOUNTER — Ambulatory Visit: Payer: BC Managed Care – PPO | Attending: Internal Medicine | Admitting: Internal Medicine

## 2023-03-03 ENCOUNTER — Ambulatory Visit: Payer: BC Managed Care – PPO | Admitting: Internal Medicine

## 2023-03-03 ENCOUNTER — Encounter: Payer: Self-pay | Admitting: Internal Medicine

## 2023-03-03 VITALS — BP 118/68 | HR 77 | Ht 69.0 in | Wt 222.2 lb

## 2023-03-03 DIAGNOSIS — I493 Ventricular premature depolarization: Secondary | ICD-10-CM | POA: Diagnosis not present

## 2023-03-03 MED ORDER — FLECAINIDE ACETATE 50 MG PO TABS: ORAL_TABLET | ORAL | 1 refills | Status: AC

## 2023-03-03 NOTE — Patient Instructions (Signed)
Medication Instructions:  Your physician has recommended you make the following change in your medication:   Take Flecainide 50 mg take 1 every hour up to 4 hours as needed for palpitations   *If you need a refill on your cardiac medications before your next appointment, please call your pharmacy*   Lab Work: NONE   If you have labs (blood work) drawn today and your tests are completely normal, you will receive your results only by: MyChart Message (if you have MyChart) OR A paper copy in the mail If you have any lab test that is abnormal or we need to change your treatment, we will call you to review the results.   Testing/Procedures: NONE    Follow-Up: At [facility], you and your health needs are our priority.  As part of our continuing mission to provide you with exceptional heart care, we have created designated Provider Care Teams.  These Care Teams include your primary Cardiologist (physician) and Advanced Practice Providers (APPs -  Physician Assistants and Nurse Practitioners) who all work together to provide you with the care you need, when you need it.  We recommend signing up for the patient portal called "MyChart".  Sign up information is provided on this After Visit Summary.  MyChart is used to connect with patients for Virtual Visits (Telemedicine).  Patients are able to view lab/test results, encounter notes, upcoming appointments, etc.  Non-urgent messages can be sent to your provider as well.   To learn more about what you can do with MyChart, go to [email or web address].    Your next appointment:    As Needed   Provider:   Lewayne Bunting, MD    Other Instructions Thank you for choosing [facility]!

## 2023-03-03 NOTE — Progress Notes (Signed)
HPI Ms. Melissa Leonard returns today for followup of her PVC's. She is a pleasant 57 yo woman with a h/o palpitations. The patient has PVC's but not always symptomatic. The patient injured her knee and is undergoing evaluation. The patient has less than 8% PVC's. I discussed the treatment options when I last saw her but she has held off on taking flecainide.  No Known Allergies   Current Outpatient Medications  Medication Sig Dispense Refill   chlorthalidone (HYGROTON) 25 MG tablet Take 1 tablet (25 mg total) by mouth daily. 90 tablet 3   diclofenac (VOLTAREN) 75 MG EC tablet Take 75 mg by mouth 2 (two) times daily.     estradiol (VIVELLE-DOT) 0.075 MG/24HR 1 patch 2 (two) times a week.     levothyroxine (SYNTHROID, LEVOTHROID) 50 MCG tablet      PARoxetine (PAXIL) 20 MG tablet Take 20 mg by mouth daily.     potassium chloride SA (KLOR-CON M20) 20 MEQ tablet Take 2 tablets (40 mEq total) by mouth daily. 180 tablet 3   progesterone (PROMETRIUM) 100 MG capsule Take 100 mg by mouth daily.     vitamin D3 (CHOLECALCIFEROL) 25 MCG tablet Take 1,000 Units by mouth daily.     diltiazem (CARDIZEM CD) 360 MG 24 hr capsule Take 1 capsule (360 mg total) by mouth daily. (Patient not taking: Reported on 03/03/2023) 90 capsule 3   flecainide (TAMBOCOR) 50 MG tablet Take 1 tablet (50 mg total) by mouth 2 (two) times daily. (Patient not taking: Reported on 03/03/2023) 180 tablet 3   rosuvastatin (CRESTOR) 5 MG tablet Take 1 tablet (5 mg total) by mouth daily. (Patient not taking: Reported on 10/29/2022) 90 tablet 3   No current facility-administered medications for this visit.     Past Medical History:  Diagnosis Date   Anxiety    Depression    Thyroid disease     ROS:   All systems reviewed and negative except as noted in the HPI.   Past Surgical History:  Procedure Laterality Date   TONSILLECTOMY AND ADENOIDECTOMY     uterine ablation       Family History  Problem Relation Age of Onset    Prostate cancer Father    Breast cancer Neg Hx      Social History   Socioeconomic History   Marital status: Married    Spouse name: Not on file   Number of children: Not on file   Years of education: Not on file   Highest education level: Not on file  Occupational History   Not on file  Tobacco Use   Smoking status: Never   Smokeless tobacco: Never  Vaping Use   Vaping status: Never Used  Substance and Sexual Activity   Alcohol use: No   Drug use: No   Sexual activity: Yes  Other Topics Concern   Not on file  Social History Narrative   Not on file   Social Determinants of Health   Financial Resource Strain: Not on file  Food Insecurity: Not on file  Transportation Needs: Not on file  Physical Activity: Not on file  Stress: Not on file  Social Connections: Not on file  Intimate Partner Violence: Not on file     BP 118/68 (BP Location: Left Arm, Patient Position: Sitting, Cuff Size: Large)   Pulse 77   Ht 5\' 9"  (1.753 m)   Wt 222 lb 3.2 oz (100.8 kg)   SpO2 95%   BMI 32.81  kg/m   Physical Exam:  Well appearing NAD HEENT: Unremarkable Neck:  No JVD, no thyromegally Lymphatics:  No adenopathy Back:  No CVA tenderness Lungs:  Clear with no wheezes HEART:  Regular rate rhythm, no murmurs, no rubs, no clicks Abd:  soft, positive bowel sounds, no organomegally, no rebound, no guarding Ext:  2 plus pulses, no edema, no cyanosis, no clubbing Skin:  No rashes no nodules Neuro:  CN II through XII intact, motor grossly intact  EKG - nsr with PVC's  Assess/Plan: PVC's - we discussed the treatment options. She is not too symptomatic. I have recommended she take prn flecainide as directed below.   Sharlot Gowda Khadeem Rockett,MD

## 2023-05-15 ENCOUNTER — Other Ambulatory Visit: Payer: Self-pay | Admitting: Internal Medicine

## 2023-10-10 ENCOUNTER — Other Ambulatory Visit: Payer: Self-pay | Admitting: Internal Medicine

## 2023-10-12 ENCOUNTER — Telehealth: Payer: Self-pay | Admitting: Internal Medicine

## 2023-10-12 NOTE — Telephone Encounter (Signed)
 [facility] filled klor -con packets thru verbal order request   Patient has f/u in june

## 2023-10-12 NOTE — Telephone Encounter (Signed)
 Pt c/o medication issue:  1. Name of Medication: Potassium Chloride   2. How are you currently taking this medication (dosage and times per day)?   3. Are you having a reaction (difficulty breathing--STAT)?   4. What is your medication issue? Patient pharmacy can no longer this medicine in the form she was taking. She needs the liquid form called in- that is the kind that her pharmacy can get. She needs this, because she can not swallow the tablet, because of her Thyroid  condition

## 2023-10-14 ENCOUNTER — Other Ambulatory Visit: Payer: Self-pay | Admitting: Endocrinology

## 2023-10-14 DIAGNOSIS — E049 Nontoxic goiter, unspecified: Secondary | ICD-10-CM

## 2023-10-28 ENCOUNTER — Ambulatory Visit
Admission: RE | Admit: 2023-10-28 | Discharge: 2023-10-28 | Disposition: A | Discharge status: Home / Self Care | Source: Ambulatory Visit | Attending: Endocrinology | Admitting: Endocrinology

## 2023-10-28 DIAGNOSIS — E049 Nontoxic goiter, unspecified: Secondary | ICD-10-CM

## 2023-11-23 ENCOUNTER — Encounter: Payer: Self-pay | Admitting: Internal Medicine

## 2023-11-23 ENCOUNTER — Ambulatory Visit: Attending: Internal Medicine | Admitting: Internal Medicine

## 2023-11-23 VITALS — BP 128/86 | HR 69 | Ht 69.5 in | Wt 228.4 lb

## 2023-11-23 DIAGNOSIS — I493 Ventricular premature depolarization: Secondary | ICD-10-CM | POA: Diagnosis not present

## 2023-11-23 DIAGNOSIS — R931 Abnormal findings on diagnostic imaging of heart and coronary circulation: Secondary | ICD-10-CM

## 2023-11-23 DIAGNOSIS — E785 Hyperlipidemia, unspecified: Secondary | ICD-10-CM | POA: Diagnosis not present

## 2023-11-23 DIAGNOSIS — R911 Solitary pulmonary nodule: Secondary | ICD-10-CM | POA: Insufficient documentation

## 2023-11-23 MED ORDER — AMLODIPINE BESYLATE 2.5 MG PO TABS: 2.5000 mg | ORAL_TABLET | Freq: Every day | ORAL | 5 refills | Status: DC

## 2023-11-23 NOTE — Patient Instructions (Signed)
 Medication Instructions:  Your physician has recommended you make the following change in your medication:  Stop taking Chlorthalidone   Stop taking Potassium Chloride   Start taking Amlodipine 2.5 mg once daily. Let us  know if you develop any swelling when taking this medication  Continue taking all other medications as prescribed   Labwork: Fasting Lipid Panel to be completed at [facility]   Testing/Procedures: None   Follow-Up: Your physician recommends that you schedule a follow-up appointment in: 1 year. You will receive a reminder call in about 8 months reminding you to schedule your appointment. If you don't receive this call, please contact our office.   Any Other Special Instructions Will Be Listed Below (If Applicable).  Provided patient with a list of Primary Care Physicians  Thank you for choosing [facility]!     If you need a refill on your cardiac medications before your next appointment, please call your pharmacy.

## 2023-11-23 NOTE — Progress Notes (Signed)
 Cardiology Office Note  Date: 11/23/2023   ID: COPELAND LAPIER, DOB 09/03/1965, MRN [id number]  PCP:  Prescilla Brod, MD  Cardiologist:  None Electrophysiologist:  None   History of Present Illness: Melissa Leonard is a 58 y.o. female  Initially referred to cardiology clinic for evaluation of palpitations.  Event monitor showed 7.9% PVC burden, symptomatic for which she was started on metoprolol , did not tolerate due to fatigue.  Metoprolol  switched to diltiazem  which she tolerated very well but did not help with the palpitations as much as metoprolol  did.  She was then referred to electrophysiology for further management.  Dr. Carolynne Citron did not think her palpitations are secondary to PVCs and recommended as needed flecainide .  She not had to take any flecainide  since it was prescribed.  No interval palpitations.  Sometimes she does feel dizzy but no passing out.  No chest pain or DOE.  Currently on chlorthalidone  25 mg once daily for HTN management.  She does have severe hypokalemia issues with chlorthalidone  and currently on KCl 60 mg once daily.  Past Medical History:  Diagnosis Date   Anxiety    Depression    Thyroid  disease     Past Surgical History:  Procedure Laterality Date   KNEE SURGERY     TONSILLECTOMY AND ADENOIDECTOMY     uterine ablation      Current Outpatient Medications  Medication Sig Dispense Refill   chlorthalidone  (HYGROTON ) 25 MG tablet TAKE 1 TABLET BY MOUTH DAILY 90 tablet 1   estradiol (VIVELLE-DOT) 0.075 MG/24HR 1 patch 2 (two) times a week.     flecainide  (TAMBOCOR ) 50 MG tablet Take 1 Tablet as needed every hour up to 4 hours for palpitations. 40 tablet 1   levothyroxine (SYNTHROID, LEVOTHROID) 50 MCG tablet      PARoxetine (PAXIL) 20 MG tablet Take 20 mg by mouth daily.     Potassium Chloride  ER 20 MEQ TBCR TAKE 2 TABLETS BY MOUTH DAILY 180 tablet 1   progesterone (PROMETRIUM) 100 MG capsule Take 100 mg by mouth daily.     vitamin D3  (CHOLECALCIFEROL) 25 MCG tablet Take 1,000 Units by mouth daily.     No current facility-administered medications for this visit.   Allergies:  Patient has no known allergies.   Social History: The patient  reports that she has never smoked. She has never used smokeless tobacco. She reports that she does not drink alcohol and does not use drugs.   Family History: The patient's family history includes Prostate cancer in her father.   ROS:  Please see the history of present illness. Otherwise, complete review of systems is positive for none  All other systems are reviewed and negative.   Physical Exam: VS:  BP 128/86   Pulse 69   Ht 5' 9.5" (1.765 m)   Wt 228 lb 6.4 oz (103.6 kg)   SpO2 95%   BMI 33.25 kg/m , BMI Body mass index is 33.25 kg/m.  Wt Readings from Last 3 Encounters:  11/23/23 228 lb 6.4 oz (103.6 kg)  03/03/23 222 lb 3.2 oz (100.8 kg)  10/29/22 220 lb (99.8 kg)    General: Patient appears comfortable at rest. HEENT: Conjunctiva and lids normal, oropharynx clear with moist mucosa. Neck: Supple, no elevated JVP or carotid bruits, no thyromegaly. Lungs: Clear to auscultation, nonlabored breathing at rest. Cardiac: Regular rate and rhythm, no S3 or significant systolic murmur, no pericardial rub. Abdomen: Soft, nontender, no hepatomegaly, bowel sounds present, no  guarding or rebound. Extremities: No pitting edema, distal pulses 2+. Skin: Warm and dry. Musculoskeletal: No kyphosis. Neuropsychiatric: Alert and oriented x3, affect grossly appropriate.  Recent Labwork: No results found for requested labs within last 365 days.  No results found for: "CHOL", "TRIG", "HDL", "CHOLHDL", "VLDL", "LDLCALC", "LDLDIRECT"    Assessment and Plan:  7.9% PVC burden: Initially symptomatic with palpitations, did not tolerate metoprolol  due to fatigue.  Tolerated diltiazem  with no side effects but did not improve her palpitations.  She was then referred electrophysiology who did not  think her palpitations are from PVC burden and recommended as needed flecainide .  She did not take any flecainide  since it was prescribed.  No interval palpitations.  EKG today showed NSR, no evidence of PVCs.  Decreased caffeine intake, drinks 1 cup of coffee daily in the morning.  Will continue to monitor.  HTN, controlled: Currently on chlorthalidone  25 mg once daily.  She has severe hypokalemia from chlorthalidone  and currently taking KCl 60 mg daily.  Will discontinue chlorthalidone , KCl and start amlodipine 2.5 mg once daily.  Discussed side effects of amlodipine including ankle swelling.  She will reach out to us  if she develops any.  Currently does not have a PCP.  Encouraged to establish care with PCP for HTN management.  Elevated coronary calcium  score: Coronary calcium  score is 39.8, 80th percentile for age and sex matched control.  Asymptomatic.  No angina or DOE.  Will obtain fasting lipid panel and start rosuvastatin  5 mg 3 times daily based on lipid panel results.  4 mm lung nodule in 2024: Needs to be scheduled with PCP for surveillance.       Medication Adjustments/Labs and Tests Ordered: Current medicines are reviewed at length with the patient today.  Concerns regarding medicines are outlined above.    Disposition:  Follow up 1 year  Signed Melissa Flaum Beauford Bounds, MD, 11/23/2023 9:07 AM    [facility] [address] [place], [place], [place] [address]

## 2023-11-25 ENCOUNTER — Other Ambulatory Visit: Payer: Self-pay | Admitting: Internal Medicine

## 2023-11-28 LAB — LIPID PANEL
Chol/HDL Ratio: 2.5 ratio (ref 0.0–4.4)
Cholesterol, Total: 193 mg/dL (ref 100–199)
HDL: 76 mg/dL (ref >39)
LDL Chol Calc (NIH): 107 mg/dL — ABNORMAL HIGH (ref 0–99)
Triglycerides: 52 mg/dL (ref 0–149)
VLDL Cholesterol Cal: 10 mg/dL (ref 5–40)

## 2023-12-14 ENCOUNTER — Ambulatory Visit: Payer: Self-pay | Admitting: Internal Medicine

## 2023-12-15 MED ORDER — ROSUVASTATIN CALCIUM 5 MG PO TABS: 5.0000 mg | ORAL_TABLET | ORAL | 5 refills | Status: AC

## 2023-12-15 NOTE — Telephone Encounter (Signed)
 Refill sent in

## 2024-05-08 ENCOUNTER — Other Ambulatory Visit: Payer: Self-pay | Admitting: Internal Medicine

## 2024-06-10 ENCOUNTER — Other Ambulatory Visit: Payer: Self-pay

## 2024-06-10 ENCOUNTER — Ambulatory Visit
Admission: EM | Admit: 2024-06-10 | Discharge: 2024-06-10 | Disposition: A | Discharge status: Home / Self Care | Attending: Family Medicine | Admitting: Family Medicine

## 2024-06-10 DIAGNOSIS — J101 Influenza due to other identified influenza virus with other respiratory manifestations: Secondary | ICD-10-CM | POA: Diagnosis not present

## 2024-06-10 DIAGNOSIS — J069 Acute upper respiratory infection, unspecified: Secondary | ICD-10-CM

## 2024-06-10 LAB — POC COVID19/FLU A&B COMBO
Covid Antigen, POC: NEGATIVE
Influenza A Antigen, POC: POSITIVE — AB
Influenza B Antigen, POC: NEGATIVE

## 2024-06-10 MED ORDER — OSELTAMIVIR PHOSPHATE 75 MG PO CAPS: 75.0000 mg | ORAL_CAPSULE | Freq: Two times a day (BID) | ORAL | 0 refills | Status: AC

## 2024-06-10 MED ORDER — AZELASTINE HCL 0.1 % NA SOLN: 1.0000 | Freq: Two times a day (BID) | NASAL | 0 refills | Status: AC

## 2024-06-10 MED ORDER — PROMETHAZINE-DM 6.25-15 MG/5ML PO SYRP: 5.0000 mL | ORAL_SOLUTION | Freq: Four times a day (QID) | ORAL | 0 refills | Status: AC | PRN

## 2024-06-10 NOTE — ED Triage Notes (Signed)
 Pt is here with a cough, bodyaches that started 2 days ago, pt has taken OTC meds to relieve discomfort.

## 2024-06-12 NOTE — ED Provider Notes (Signed)
 " [facility]    CSN: [id number] Arrival date & time: 06/10/24  1008      History   Chief Complaint Chief Complaint  Patient presents with   Cough   bodyaches    HPI Melissa Leonard is a 58 y.o. female.   Patient presenting today with 2-day history of cough, body aches, congestion, fatigue.  Denies fever, chest pain, shortness of breath, abdominal pain, vomiting, diarrhea.  So far trying over-the-counter cold congestion medication with minimal relief.    Past Medical History:  Diagnosis Date   Anxiety    Depression    Thyroid  disease     Patient Active Problem List   Diagnosis Date Noted   Lung nodule 11/23/2023   Elevated coronary artery calcium  score 11/23/2023   Frequent PVCs 07/23/2022   Palpitations 05/05/2022   HTN (hypertension), benign 05/05/2022    Past Surgical History:  Procedure Laterality Date   KNEE SURGERY     TONSILLECTOMY AND ADENOIDECTOMY     uterine ablation      OB History   No obstetric history on file.      Home Medications    Prior to Admission medications  Medication Sig Start Date End Date Taking? Authorizing Provider  azelastine  (ASTELIN ) 0.1 % nasal spray Place 1 spray into both nostrils 2 (two) times daily. Use in each nostril as directed 06/10/24  Yes Stuart Vernell Almarie, PA-C  oseltamivir  (TAMIFLU ) 75 MG capsule Take 1 capsule (75 mg total) by mouth every 12 (twelve) hours. 06/10/24  Yes Stuart Vernell Almarie, PA-C  promethazine -dextromethorphan (PROMETHAZINE -DM) 6.25-15 MG/5ML syrup Take 5 mLs by mouth 4 (four) times daily as needed. 06/10/24  Yes Stuart Vernell Almarie, PA-C  amLODipine  (NORVASC ) 2.5 MG tablet TAKE 1 TABLET BY MOUTH EVERY DAY 05/10/24   Mallipeddi, Vishnu P, MD  estradiol (VIVELLE-DOT) 0.075 MG/24HR 1 patch 2 (two) times a week.    [provider]  flecainide  (TAMBOCOR ) 50 MG tablet Take 1 Tablet as needed every hour up to 4 hours for palpitations. 03/03/23   Waddell Danelle ORN, MD   levothyroxine (SYNTHROID, LEVOTHROID) 50 MCG tablet  02/24/14   [provider]  PARoxetine (PAXIL) 20 MG tablet Take 20 mg by mouth daily. 02/03/14   [provider]  progesterone (PROMETRIUM) 100 MG capsule Take 100 mg by mouth daily.    [provider]  rosuvastatin  (CRESTOR ) 5 MG tablet Take 1 tablet (5 mg total) by mouth 2 (two) times a week. 12/17/23   Mallipeddi, Vishnu P, MD  vitamin D3 (CHOLECALCIFEROL) 25 MCG tablet Take 1,000 Units by mouth daily.    [provider]    Family History Family History  Problem Relation Age of Onset   Prostate cancer Father    Breast cancer Neg Hx     Social History Social History[1]   Allergies   Patient has no known allergies.   Review of Systems Review of Systems Per HPI  Physical Exam Triage Vital Signs ED Triage Vitals  Encounter Vitals Group     BP 06/10/24 1106 108/76     Girls Systolic BP Percentile --      Girls Diastolic BP Percentile --      Boys Systolic BP Percentile --      Boys Diastolic BP Percentile --      Pulse Rate 06/10/24 1106 91     Resp 06/10/24 1106 19     Temp 06/10/24 1106 99.9 F (37.7 C)     Temp  Source 06/10/24 1106 Oral     SpO2 06/10/24 1106 92 %     Weight --      Height --      Head Circumference --      Peak Flow --      Pain Score 06/10/24 1104 0     Pain Loc --      Pain Education --      Exclude from Growth Chart --    No data found.  Updated Vital Signs BP 108/76 (BP Location: Right Arm)   Pulse 91   Temp 99.9 F (37.7 C) (Oral)   Resp 19   SpO2 92%   Visual Acuity Right Eye Distance:   Left Eye Distance:   Bilateral Distance:    Right Eye Near:   Left Eye Near:    Bilateral Near:     Physical Exam Vitals and nursing note reviewed.  Constitutional:      Appearance: Normal appearance.  HENT:     Head: Atraumatic.     Right Ear: Tympanic membrane and external ear normal.     Left Ear: Tympanic membrane and external ear normal.      Nose: Rhinorrhea present.     Mouth/Throat:     Mouth: Mucous membranes are moist.     Pharynx: Posterior oropharyngeal erythema present.  Eyes:     Extraocular Movements: Extraocular movements intact.     Conjunctiva/sclera: Conjunctivae normal.  Cardiovascular:     Rate and Rhythm: Normal rate and regular rhythm.     Heart sounds: Normal heart sounds.  Pulmonary:     Effort: Pulmonary effort is normal.     Breath sounds: Normal breath sounds. No wheezing.  Musculoskeletal:        General: Normal range of motion.     Cervical back: Normal range of motion and neck supple.  Skin:    General: Skin is warm and dry.  Neurological:     Mental Status: She is alert and oriented to person, place, and time.  Psychiatric:        Mood and Affect: Mood normal.        Thought Content: Thought content normal.      UC Treatments / Results  Labs (all labs ordered are listed, but only abnormal results are displayed) Labs Reviewed  POC COVID19/FLU A&B COMBO - Abnormal; Notable for the following components:      Result Value   Influenza A Antigen, POC Positive (*)    All other components within normal limits    EKG   Radiology No results found.  Procedures Procedures (including critical care time)  Medications Ordered in UC Medications - No data to display  Initial Impression / Assessment and Plan / UC Course  I have reviewed the triage vital signs and the nursing notes.  Pertinent labs & imaging results that were available during my care of the patient were reviewed by me and considered in my medical decision making (see chart for details).     Vital signs and exam reassuring today, rapid flu positive for influenza A.  Treat with Tamiflu , Astelin , Phenergan  DM, supportive over-the-counter medications and home care.  Return for worsening or unresolving symptoms.  Final Clinical Impressions(s) / UC Diagnoses   Final diagnoses:  Viral URI with cough  Influenza A   Discharge  Instructions   None    ED Prescriptions     Medication Sig Dispense Auth. Provider   oseltamivir  (TAMIFLU ) 75 MG capsule Take 1  capsule (75 mg total) by mouth every 12 (twelve) hours. 10 capsule Stuart Vernell Norris, PA-C   azelastine  (ASTELIN ) 0.1 % nasal spray Place 1 spray into both nostrils 2 (two) times daily. Use in each nostril as directed 30 mL Stuart Vernell Norris, PA-C   promethazine -dextromethorphan (PROMETHAZINE -DM) 6.25-15 MG/5ML syrup Take 5 mLs by mouth 4 (four) times daily as needed. 100 mL Stuart Vernell Norris, [place]      PDMP not reviewed this encounter.    [1]  Social History Tobacco Use   Smoking status: Never   Smokeless tobacco: Never  Vaping Use   Vaping status: Never Used  Substance Use Topics   Alcohol use: No   Drug use: No     Shimika, Ames, PA-C 06/12/24 1502  "

## 2024-07-29 ENCOUNTER — Other Ambulatory Visit: Payer: Self-pay | Admitting: Internal Medicine
